# Patient Record
Sex: Female | Born: 1995 | Race: White | Hispanic: No | Marital: Married | Smoking: Never smoker
Health system: Southern US, Community
[De-identification: ages and names within clinical notes are randomized; demographics above are authoritative.]

## PROBLEM LIST (undated history)

## (undated) DIAGNOSIS — Q513 Bicornate uterus: Secondary | ICD-10-CM

## (undated) DIAGNOSIS — O3400 Maternal care for unspecified congenital malformation of uterus, unspecified trimester: Secondary | ICD-10-CM

## (undated) DIAGNOSIS — Z8759 Personal history of other complications of pregnancy, childbirth and the puerperium: Secondary | ICD-10-CM

## (undated) DIAGNOSIS — N809 Endometriosis, unspecified: Secondary | ICD-10-CM

## (undated) HISTORY — PX: LEFT OOPHORECTOMY: SHX1961

## (undated) HISTORY — DX: Personal history of other complications of pregnancy, childbirth and the puerperium: Z87.59

## (undated) HISTORY — DX: Endometriosis, unspecified: N80.9

## (undated) HISTORY — DX: Bicornate uterus: Q51.3

## (undated) HISTORY — DX: Maternal care for unspecified congenital malformation of uterus, unspecified trimester: O34.00

## (undated) HISTORY — PX: OTHER SURGICAL HISTORY: SHX169

---
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
## 2018-12-20 LAB — OB RESULTS CONSOLE GC/CHLAMYDIA
Chlamydia: NEGATIVE
Gonorrhea: NEGATIVE

## 2018-12-20 LAB — OB RESULTS CONSOLE RPR: RPR: NONREACTIVE

## 2018-12-20 LAB — OB RESULTS CONSOLE RUBELLA ANTIBODY, IGM: Rubella: IMMUNE

## 2018-12-20 LAB — OB RESULTS CONSOLE ABO/RH: RH Type: NEGATIVE

## 2018-12-20 LAB — OB RESULTS CONSOLE HGB/HCT, BLOOD
HCT: 43 — AB (ref 29–41)
Hemoglobin: 14

## 2018-12-20 LAB — OB RESULTS CONSOLE HEPATITIS B SURFACE ANTIGEN: Hepatitis B Surface Ag: NEGATIVE

## 2018-12-20 LAB — OB RESULTS CONSOLE ANTIBODY SCREEN: Antibody Screen: NEGATIVE

## 2018-12-20 LAB — OB RESULTS CONSOLE HIV ANTIBODY (ROUTINE TESTING): HIV: NONREACTIVE

## 2018-12-20 LAB — OB RESULTS CONSOLE PLATELET COUNT: Platelets: 456

## 2019-03-13 ENCOUNTER — Telehealth: Payer: Self-pay | Admitting: Student

## 2019-03-13 NOTE — Telephone Encounter (Signed)
Called the patient to inform of the upcoming scheduled appointment. Received a message the text now subscriber you are trying to reach is not available. Mailing the patient an appointment reminder letter.

## 2019-03-14 ENCOUNTER — Encounter: Payer: Self-pay | Admitting: *Deleted

## 2019-03-14 DIAGNOSIS — Z87718 Personal history of other specified (corrected) congenital malformations of genitourinary system: Secondary | ICD-10-CM | POA: Insufficient documentation

## 2019-03-14 DIAGNOSIS — N809 Endometriosis, unspecified: Secondary | ICD-10-CM | POA: Insufficient documentation

## 2019-03-14 DIAGNOSIS — Q513 Bicornate uterus: Secondary | ICD-10-CM

## 2019-03-14 DIAGNOSIS — O3400 Maternal care for unspecified congenital malformation of uterus, unspecified trimester: Secondary | ICD-10-CM

## 2019-03-21 ENCOUNTER — Encounter: Payer: Self-pay | Admitting: *Deleted

## 2019-04-02 ENCOUNTER — Encounter: Payer: Self-pay | Admitting: Obstetrics & Gynecology

## 2019-04-02 ENCOUNTER — Ambulatory Visit (INDEPENDENT_AMBULATORY_CARE_PROVIDER_SITE_OTHER): Payer: BC Managed Care – PPO | Admitting: Obstetrics & Gynecology

## 2019-04-02 ENCOUNTER — Other Ambulatory Visit: Payer: Self-pay

## 2019-04-02 DIAGNOSIS — O0990 Supervision of high risk pregnancy, unspecified, unspecified trimester: Secondary | ICD-10-CM | POA: Insufficient documentation

## 2019-04-02 DIAGNOSIS — Z23 Encounter for immunization: Secondary | ICD-10-CM | POA: Diagnosis not present

## 2019-04-02 DIAGNOSIS — Z6791 Unspecified blood type, Rh negative: Secondary | ICD-10-CM | POA: Insufficient documentation

## 2019-04-02 DIAGNOSIS — Z3A20 20 weeks gestation of pregnancy: Secondary | ICD-10-CM

## 2019-04-02 DIAGNOSIS — O0992 Supervision of high risk pregnancy, unspecified, second trimester: Secondary | ICD-10-CM

## 2019-04-02 DIAGNOSIS — O360120 Maternal care for anti-D [Rh] antibodies, second trimester, not applicable or unspecified: Secondary | ICD-10-CM

## 2019-04-02 DIAGNOSIS — O26899 Other specified pregnancy related conditions, unspecified trimester: Secondary | ICD-10-CM | POA: Insufficient documentation

## 2019-04-02 LAB — POCT URINALYSIS DIP (DEVICE)
Bilirubin Urine: NEGATIVE
Glucose, UA: NEGATIVE mg/dL
Hgb urine dipstick: NEGATIVE
Ketones, ur: NEGATIVE mg/dL
Leukocytes,Ua: NEGATIVE
Nitrite: NEGATIVE
Protein, ur: NEGATIVE mg/dL
Specific Gravity, Urine: 1.025 (ref 1.005–1.030)
Urobilinogen, UA: 1 mg/dL (ref 0.0–1.0)
pH: 6.5 (ref 5.0–8.0)

## 2019-04-02 MED ORDER — BLOOD PRESSURE KIT DEVI: 1.0000 | 0 refills | Status: DC | PRN

## 2019-04-02 NOTE — Patient Instructions (Signed)

## 2019-04-02 NOTE — Progress Notes (Signed)
Flu vaccine given today. 
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
## 2019-04-11 ENCOUNTER — Other Ambulatory Visit: Payer: Self-pay

## 2019-04-11 ENCOUNTER — Other Ambulatory Visit (HOSPITAL_COMMUNITY): Payer: Self-pay | Admitting: *Deleted

## 2019-04-11 ENCOUNTER — Ambulatory Visit (HOSPITAL_COMMUNITY): Payer: BC Managed Care – PPO | Admitting: *Deleted

## 2019-04-11 ENCOUNTER — Ambulatory Visit (HOSPITAL_COMMUNITY)
Admission: RE | Admit: 2019-04-11 | Discharge: 2019-04-11 | Disposition: A | Discharge status: Home / Self Care | Payer: BC Managed Care – PPO | Source: Ambulatory Visit | Attending: Obstetrics & Gynecology | Admitting: Obstetrics & Gynecology

## 2019-04-11 ENCOUNTER — Encounter (HOSPITAL_COMMUNITY): Payer: Self-pay

## 2019-04-11 VITALS — BP 117/77 | HR 85 | Temp 98.7°F

## 2019-04-11 DIAGNOSIS — Z3A22 22 weeks gestation of pregnancy: Secondary | ICD-10-CM

## 2019-04-11 DIAGNOSIS — Q513 Bicornate uterus: Secondary | ICD-10-CM

## 2019-04-11 DIAGNOSIS — O34592 Maternal care for other abnormalities of gravid uterus, second trimester: Secondary | ICD-10-CM

## 2019-04-11 DIAGNOSIS — O0990 Supervision of high risk pregnancy, unspecified, unspecified trimester: Secondary | ICD-10-CM

## 2019-04-11 DIAGNOSIS — O3400 Maternal care for unspecified congenital malformation of uterus, unspecified trimester: Secondary | ICD-10-CM

## 2019-04-11 DIAGNOSIS — O360120 Maternal care for anti-D [Rh] antibodies, second trimester, not applicable or unspecified: Secondary | ICD-10-CM

## 2019-04-30 ENCOUNTER — Encounter: Payer: Self-pay | Admitting: Obstetrics and Gynecology

## 2019-04-30 ENCOUNTER — Other Ambulatory Visit: Payer: Self-pay

## 2019-04-30 ENCOUNTER — Telehealth (INDEPENDENT_AMBULATORY_CARE_PROVIDER_SITE_OTHER): Payer: BC Managed Care – PPO | Admitting: Obstetrics and Gynecology

## 2019-04-30 VITALS — BP 120/76 | HR 78

## 2019-04-30 DIAGNOSIS — Z6791 Unspecified blood type, Rh negative: Secondary | ICD-10-CM

## 2019-04-30 DIAGNOSIS — O3402 Maternal care for unspecified congenital malformation of uterus, second trimester: Secondary | ICD-10-CM

## 2019-04-30 DIAGNOSIS — O283 Abnormal ultrasonic finding on antenatal screening of mother: Secondary | ICD-10-CM | POA: Insufficient documentation

## 2019-04-30 DIAGNOSIS — O360920 Maternal care for other rhesus isoimmunization, second trimester, not applicable or unspecified: Secondary | ICD-10-CM

## 2019-04-30 DIAGNOSIS — O0992 Supervision of high risk pregnancy, unspecified, second trimester: Secondary | ICD-10-CM

## 2019-04-30 DIAGNOSIS — Q513 Bicornate uterus: Secondary | ICD-10-CM

## 2019-04-30 DIAGNOSIS — O0990 Supervision of high risk pregnancy, unspecified, unspecified trimester: Secondary | ICD-10-CM

## 2019-04-30 DIAGNOSIS — O26892 Other specified pregnancy related conditions, second trimester: Secondary | ICD-10-CM

## 2019-04-30 DIAGNOSIS — O3400 Maternal care for unspecified congenital malformation of uterus, unspecified trimester: Secondary | ICD-10-CM

## 2019-04-30 NOTE — Progress Notes (Signed)
   TELEHEALTH VIRTUAL OBSTETRICS VISIT ENCOUNTER NOTE  [facility]inic: [facility]  I connected with Amanda Clements on 04/30/19 at 11:15 AM EDT by telephone at home and verified that I am speaking with the correct person using two identifiers.   I discussed the limitations, risks, security and privacy concerns of performing an evaluation and management service by telephone and the availability of in person appointments. I also discussed with the patient that there may be a patient responsible charge related to this service. The patient expressed understanding and agreed to proceed.  Subjective:  Amanda Clements is a 23 y.o. G2P0010 at [redacted]w[redacted]d being followed for ongoing prenatal care.  She is currently monitored for the following issues for this high-risk pregnancy and has Bicornate uterus complicating pregnancy; Endometriosis; Rh negative, antepartum; and Supervision of high risk pregnancy, antepartum on their problem list.  Patient reports no complaints. Reports fetal movement. Denies any contractions, bleeding or leaking of fluid.   The following portions of the patient's history were reviewed and updated as appropriate: allergies, current medications, past family history, past medical history, past social history, past surgical history and problem list.   Objective:   Vitals:   04/30/19 1121  BP: 120/76  Pulse: 78    Babyscripts Data Reviewed: yes  General:  Alert, oriented and cooperative.   Mental Status: Normal mood and affect perceived. Normal judgment and thought content.  Rest of physical exam deferred due to type of encounter  Assessment and Plan:  Pregnancy: G2P0010 at [redacted]w[redacted]d 1. Rh negative, antepartum Ab screen and rhogma nv  2. Supervision of high risk pregnancy, antepartum 28wk labs nv  3. Bicornuate uterus affecting pregnancy, antepartum mfm recommends growth in 3rd trimester D/w her re: increased risk of PTL, malpresentation, FGR  Preterm labor  symptoms and general obstetric precautions including but not limited to vaginal bleeding, contractions, leaking of fluid and fetal movement were reviewed in detail with the patient.  I discussed the assessment and treatment plan with the patient. The patient was provided an opportunity to ask questions and all were answered. The patient agreed with the plan and demonstrated an understanding of the instructions. The patient was advised to call back or seek an in-person office evaluation/go to [facility] at [facility] for any urgent or concerning symptoms. Please refer to After Visit Summary for other counseling recommendations.   I provided 10 minutes of non-face-to-face time during this encounter. The visit was conducted via Phone-medicine  Return in about 2 weeks (around 05/14/2019) for 2-3wk in person hrob and 2h GTT.  Future Appointments  Date Time Provider [place]  06/20/2019  9:15 AM [place] M[facility]-US  06/20/2019  9:15 AM [facility]-MFC [place] 4 [facility]-MFCUS MFC-US    Jordyan Hardiman, MD [facility], [place]

## 2019-05-18 ENCOUNTER — Ambulatory Visit (INDEPENDENT_AMBULATORY_CARE_PROVIDER_SITE_OTHER): Payer: BC Managed Care – PPO | Admitting: Obstetrics and Gynecology

## 2019-05-18 ENCOUNTER — Other Ambulatory Visit: Payer: Self-pay

## 2019-05-18 ENCOUNTER — Other Ambulatory Visit: Payer: BC Managed Care – PPO

## 2019-05-18 ENCOUNTER — Encounter: Payer: Self-pay | Admitting: Obstetrics and Gynecology

## 2019-05-18 VITALS — BP 114/66 | HR 79 | Temp 98.1°F | Wt 197.9 lb

## 2019-05-18 DIAGNOSIS — O0992 Supervision of high risk pregnancy, unspecified, second trimester: Secondary | ICD-10-CM

## 2019-05-18 DIAGNOSIS — Z6791 Unspecified blood type, Rh negative: Secondary | ICD-10-CM

## 2019-05-18 DIAGNOSIS — O26899 Other specified pregnancy related conditions, unspecified trimester: Secondary | ICD-10-CM

## 2019-05-18 DIAGNOSIS — O0990 Supervision of high risk pregnancy, unspecified, unspecified trimester: Secondary | ICD-10-CM

## 2019-05-18 DIAGNOSIS — Z3A27 27 weeks gestation of pregnancy: Secondary | ICD-10-CM | POA: Diagnosis not present

## 2019-05-18 DIAGNOSIS — O360120 Maternal care for anti-D [Rh] antibodies, second trimester, not applicable or unspecified: Secondary | ICD-10-CM

## 2019-05-18 DIAGNOSIS — Q513 Bicornate uterus: Secondary | ICD-10-CM

## 2019-05-18 DIAGNOSIS — O3402 Maternal care for unspecified congenital malformation of uterus, second trimester: Secondary | ICD-10-CM

## 2019-05-18 DIAGNOSIS — O26892 Other specified pregnancy related conditions, second trimester: Secondary | ICD-10-CM

## 2019-05-18 MED ORDER — RHO D IMMUNE GLOBULIN 1500 UNIT/2ML IJ SOSY
300.0000 ug | PREFILLED_SYRINGE | Freq: Once | INTRAMUSCULAR | Status: AC
  Administered 2019-05-18: 300 ug via INTRAMUSCULAR

## 2019-05-18 NOTE — Progress Notes (Signed)
Subjective:  Amanda Clements is a 23 y.o. G2P0010 at [redacted]w[redacted]d being seen today for ongoing prenatal care.  She is currently monitored for the following issues for this high-risk pregnancy and has Bicornate uterus complicating pregnancy; Endometriosis; Rh negative, antepartum; and Supervision of high risk pregnancy, antepartum on their problem list.  Patient reports no complaints.  Contractions: Not present. Vag. Bleeding: None.  Movement: Present. Denies leaking of fluid.   The following portions of the patient's history were reviewed and updated as appropriate: allergies, current medications, past family history, past medical history, past social history, past surgical history and problem list. Problem list updated.  Objective:   Vitals:   05/18/19 0902  BP: 114/66  Pulse: 79  Temp: 98.1 F (36.7 C)  Weight: 197 lb 14.4 oz (89.8 kg)    Fetal Status: Fetal Heart Rate (bpm): 138   Movement: Present     General:  Alert, oriented and cooperative. Patient is in no acute distress.  Skin: Skin is warm and dry. No rash noted.   Cardiovascular: Normal heart rate noted  Respiratory: Normal respiratory effort, no problems with respiration noted  Abdomen: Soft, gravid, appropriate for gestational age. Pain/Pressure: Present     Pelvic:  Cervical exam deferred        Extremities: Normal range of motion.  Edema: None  Mental Status: Normal mood and affect. Normal behavior. Normal judgment and thought content.   Urinalysis:      Assessment and Plan:  Pregnancy: G2P0010 at [redacted]w[redacted]d  1. Supervision of high risk pregnancy, antepartum Stable 28 week labs today  2. Rh negative, antepartum Rhogam today  3. Uterus bicornis affecting pregnancy in second trimester Stable F/U growth scan scheduled  Preterm labor symptoms and general obstetric precautions including but not limited to vaginal bleeding, contractions, leaking of fluid and fetal movement were reviewed in detail with the patient. Please refer  to After Visit Summary for other counseling recommendations.  Return in about 3 weeks (around 06/08/2019) for OB visit, virtual.   Chancy Milroy, MD

## 2019-05-18 NOTE — Patient Instructions (Signed)
Third Trimester of Pregnancy The third trimester is from week 28 through week 40 (months 7 through 9). The third trimester is a time when the unborn baby (fetus) is growing rapidly. At the end of the ninth month, the fetus is about 20 inches in length and weighs 6-10 pounds. Body changes during your third trimester Your body will continue to go through many changes during pregnancy. The changes vary from woman to woman. During the third trimester:  Your weight will continue to increase. You can expect to gain 25-35 pounds (11-16 kg) by the end of the pregnancy.  You may begin to get stretch marks on your hips, abdomen, and breasts.  You may urinate more often because the fetus is moving lower into your pelvis and pressing on your bladder.  You may develop or continue to have heartburn. This is caused by increased hormones that slow down muscles in the digestive tract.  You may develop or continue to have constipation because increased hormones slow digestion and cause the muscles that push waste through your intestines to relax.  You may develop hemorrhoids. These are swollen veins (varicose veins) in the rectum that can itch or be painful.  You may develop swollen, bulging veins (varicose veins) in your legs.  You may have increased body aches in the pelvis, back, or thighs. This is due to weight gain and increased hormones that are relaxing your joints.  You may have changes in your hair. These can include thickening of your hair, rapid growth, and changes in texture. Some women also have hair loss during or after pregnancy, or hair that feels dry or thin. Your hair will most likely return to normal after your baby is born.  Your breasts will continue to grow and they will continue to become tender. A yellow fluid (colostrum) may leak from your breasts. This is the first milk you are producing for your baby.  Your belly button may stick out.  You may notice more swelling in your hands,  face, or ankles.  You may have increased tingling or numbness in your hands, arms, and legs. The skin on your belly may also feel numb.  You may feel short of breath because of your expanding uterus.  You may have more problems sleeping. This can be caused by the size of your belly, increased need to urinate, and an increase in your body's metabolism.  You may notice the fetus "dropping," or moving lower in your abdomen (lightening).  You may have increased vaginal discharge.  You may notice your joints feel loose and you may have pain around your pelvic bone. What to expect at prenatal visits You will have prenatal exams every 2 weeks until week 36. Then you will have weekly prenatal exams. During a routine prenatal visit:  You will be weighed to make sure you and the baby are growing normally.  Your blood pressure will be taken.  Your abdomen will be measured to track your baby's growth.  The fetal heartbeat will be listened to.  Any test results from the previous visit will be discussed.  You may have a cervical check near your due date to see if your cervix has softened or thinned (effaced).  You will be tested for Group B streptococcus. This happens between 35 and 37 weeks. Your health care provider may ask you:  What your birth plan is.  How you are feeling.  If you are feeling the baby move.  If you have had any abnormal   symptoms, such as leaking fluid, bleeding, severe headaches, or abdominal cramping.  If you are using any tobacco products, including cigarettes, chewing tobacco, and electronic cigarettes.  If you have any questions. Other tests or screenings that may be performed during your third trimester include:  Blood tests that check for low iron levels (anemia).  Fetal testing to check the health, activity level, and growth of the fetus. Testing is done if you have certain medical conditions or if there are problems during the pregnancy.  Nonstress test  (NST). This test checks the health of your baby to make sure there are no signs of problems, such as the baby not getting enough oxygen. During this test, a belt is placed around your belly. The baby is made to move, and its heart rate is monitored during movement. What is false labor? False labor is a condition in which you feel small, irregular tightenings of the muscles in the womb (contractions) that usually go away with rest, changing position, or drinking water. These are called Braxton Hicks contractions. Contractions may last for hours, days, or even weeks before true labor sets in. If contractions come at regular intervals, become more frequent, increase in intensity, or become painful, you should see your health care provider. What are the signs of labor?  Abdominal cramps.  Regular contractions that start at 10 minutes apart and become stronger and more frequent with time.  Contractions that start on the top of the uterus and spread down to the lower abdomen and back.  Increased pelvic pressure and dull back pain.  A watery or bloody mucus discharge that comes from the vagina.  Leaking of amniotic fluid. This is also known as your "water breaking." It could be a slow trickle or a gush. Let your health care provider know if it has a color or strange odor. If you have any of these signs, call your health care provider right away, even if it is before your due date. Follow these instructions at home: Medicines  Follow your health care provider's instructions regarding medicine use. Specific medicines may be either safe or unsafe to take during pregnancy.  Take a prenatal vitamin that contains at least 600 micrograms (mcg) of folic acid.  If you develop constipation, try taking a stool softener if your health care provider approves. Eating and drinking   Eat a balanced diet that includes fresh fruits and vegetables, whole grains, good sources of protein such as meat, eggs, or tofu,  and low-fat dairy. Your health care provider will help you determine the amount of weight gain that is right for you.  Avoid raw meat and uncooked cheese. These carry germs that can cause birth defects in the baby.  If you have low calcium intake from food, talk to your health care provider about whether you should take a daily calcium supplement.  Eat four or five small meals rather than three large meals a day.  Limit foods that are high in fat and processed sugars, such as fried and sweet foods.  To prevent constipation: ? Drink enough fluid to keep your urine clear or pale yellow. ? Eat foods that are high in fiber, such as fresh fruits and vegetables, whole grains, and beans. Activity  Exercise only as directed by your health care provider. Most women can continue their usual exercise routine during pregnancy. Try to exercise for 30 minutes at least 5 days a week. Stop exercising if you experience uterine contractions.  Avoid heavy lifting.  Do   not exercise in extreme heat or humidity, or at high altitudes.  Wear low-heel, comfortable shoes.  Practice good posture.  You may continue to have sex unless your health care provider tells you otherwise. Relieving pain and discomfort  Take frequent breaks and rest with your legs elevated if you have leg cramps or low back pain.  Take warm sitz baths to soothe any pain or discomfort caused by hemorrhoids. Use hemorrhoid cream if your health care provider approves.  Wear a good support bra to prevent discomfort from breast tenderness.  If you develop varicose veins: ? Wear support pantyhose or compression stockings as told by your healthcare provider. ? Elevate your feet for 15 minutes, 3-4 times a day. Prenatal care  Write down your questions. Take them to your prenatal visits.  Keep all your prenatal visits as told by your health care provider. This is important. Safety  Wear your seat belt at all times when driving.  Make  a list of emergency phone numbers, including numbers for family, friends, the hospital, and police and fire departments. General instructions  Avoid cat litter boxes and soil used by cats. These carry germs that can cause birth defects in the baby. If you have a cat, ask someone to clean the litter box for you.  Do not travel far distances unless it is absolutely necessary and only with the approval of your health care provider.  Do not use hot tubs, steam rooms, or saunas.  Do not drink alcohol.  Do not use any products that contain nicotine or tobacco, such as cigarettes and e-cigarettes. If you need help quitting, ask your health care provider.  Do not use any medicinal herbs or unprescribed drugs. These chemicals affect the formation and growth of the baby.  Do not douche or use tampons or scented sanitary pads.  Do not cross your legs for long periods of time.  To prepare for the arrival of your baby: ? Take prenatal classes to understand, practice, and ask questions about labor and delivery. ? Make a trial run to the hospital. ? Visit the hospital and tour the maternity area. ? Arrange for maternity or paternity leave through employers. ? Arrange for family and friends to take care of pets while you are in the hospital. ? Purchase a rear-facing car seat and make sure you know how to install it in your car. ? Pack your hospital bag. ? Prepare the baby's nursery. Make sure to remove all pillows and stuffed animals from the baby's crib to prevent suffocation.  Visit your dentist if you have not gone during your pregnancy. Use a soft toothbrush to brush your teeth and be gentle when you floss. Contact a health care provider if:  You are unsure if you are in labor or if your water has broken.  You become dizzy.  You have mild pelvic cramps, pelvic pressure, or nagging pain in your abdominal area.  You have lower back pain.  You have persistent nausea, vomiting, or diarrhea.   You have an unusual or bad smelling vaginal discharge.  You have pain when you urinate. Get help right away if:  Your water breaks before 37 weeks.  You have regular contractions less than 5 minutes apart before 37 weeks.  You have a fever.  You are leaking fluid from your vagina.  You have spotting or bleeding from your vagina.  You have severe abdominal pain or cramping.  You have rapid weight loss or weight gain.  You have   shortness of breath with chest pain.  You notice sudden or extreme swelling of your face, hands, ankles, feet, or legs.  Your baby makes fewer than 10 movements in 2 hours.  You have severe headaches that do not go away when you take medicine.  You have vision changes. Summary  The third trimester is from week 28 through week 40, months 7 through 9. The third trimester is a time when the unborn baby (fetus) is growing rapidly.  During the third trimester, your discomfort may increase as you and your baby continue to gain weight. You may have abdominal, leg, and back pain, sleeping problems, and an increased need to urinate.  During the third trimester your breasts will keep growing and they will continue to become tender. A yellow fluid (colostrum) may leak from your breasts. This is the first milk you are producing for your baby.  False labor is a condition in which you feel small, irregular tightenings of the muscles in the womb (contractions) that eventually go away. These are called Braxton Hicks contractions. Contractions may last for hours, days, or even weeks before true labor sets in.  Signs of labor can include: abdominal cramps; regular contractions that start at 10 minutes apart and become stronger and more frequent with time; watery or bloody mucus discharge that comes from the vagina; increased pelvic pressure and dull back pain; and leaking of amniotic fluid. This information is not intended to replace advice given to you by your health  care provider. Make sure you discuss any questions you have with your health care provider. Document Released: 06/29/2001 Document Revised: 10/26/2018 Document Reviewed: 08/10/2016 Elsevier Patient Education  2020 [employer]  

## 2019-05-19 LAB — CBC
Hematocrit: 34.3 % (ref 34.0–46.6)
Hemoglobin: 11.4 g/dL (ref 11.1–15.9)
MCH: 29.8 pg (ref 26.6–33.0)
MCHC: 33.2 g/dL (ref 31.5–35.7)
MCV: 90 fL (ref 79–97)
Platelets: 416 10*3/uL (ref 150–450)
RBC: 3.82 x10E6/uL (ref 3.77–5.28)
RDW: 12.4 % (ref 11.7–15.4)
WBC: 12.9 10*3/uL — ABNORMAL HIGH (ref 3.4–10.8)

## 2019-05-19 LAB — GLUCOSE TOLERANCE, 2 HOURS W/ 1HR
Glucose, 1 hour: 85 mg/dL (ref 65–179)
Glucose, 2 hour: 73 mg/dL (ref 65–152)
Glucose, Fasting: 72 mg/dL (ref 65–91)

## 2019-05-19 LAB — RPR: RPR Ser Ql: NONREACTIVE

## 2019-05-19 LAB — ANTIBODY SCREEN: Antibody Screen: NEGATIVE

## 2019-05-19 LAB — HIV ANTIBODY (ROUTINE TESTING W REFLEX): HIV Screen 4th Generation wRfx: NONREACTIVE

## 2019-06-11 ENCOUNTER — Other Ambulatory Visit: Payer: Self-pay

## 2019-06-11 ENCOUNTER — Telehealth (INDEPENDENT_AMBULATORY_CARE_PROVIDER_SITE_OTHER): Payer: BC Managed Care – PPO | Admitting: Family Medicine

## 2019-06-11 DIAGNOSIS — O0993 Supervision of high risk pregnancy, unspecified, third trimester: Secondary | ICD-10-CM

## 2019-06-11 DIAGNOSIS — Q513 Bicornate uterus: Secondary | ICD-10-CM

## 2019-06-11 DIAGNOSIS — O0990 Supervision of high risk pregnancy, unspecified, unspecified trimester: Secondary | ICD-10-CM

## 2019-06-11 DIAGNOSIS — Z3A30 30 weeks gestation of pregnancy: Secondary | ICD-10-CM

## 2019-06-11 DIAGNOSIS — O3403 Maternal care for unspecified congenital malformation of uterus, third trimester: Secondary | ICD-10-CM

## 2019-06-11 NOTE — Progress Notes (Signed)
    TELEHEALTH OBSTETRICS PRENATAL VIRTUAL VIDEO VISIT ENCOUNTER NOTE  Provider location: [facility] [place]   I connected with Amanda Clements on 06/11/19 at  9:15 AM EST by MyChart Video Encounter at home and verified that I am speaking with the correct person using two identifiers.   I discussed the limitations, risks, security and privacy concerns of performing an evaluation and management service virtually and the availability of in person appointments. I also discussed with the patient that there may be a patient responsible charge related to this service. The patient expressed understanding and agreed to proceed. Subjective:  Amanda Clements is a 23 y.o. G2P0010 at [redacted]w[redacted]d being seen today for ongoing prenatal care.  She is currently monitored for the following issues for this high-risk pregnancy and has Bicornate uterus complicating pregnancy; Endometriosis; Rh negative, antepartum; and Supervision of high risk pregnancy, antepartum on their problem list.  Patient reports no complaints.  Contractions: Not present. Vag. Bleeding: None.  Movement: Present. Denies any leaking of fluid.   The following portions of the patient's history were reviewed and updated as appropriate: allergies, current medications, past family history, past medical history, past social history, past surgical history and problem list.   Objective:  There were no vitals filed for this visit.  Fetal Status:     Movement: Present     General:  Alert, oriented and cooperative. Patient is in no acute distress.  Respiratory: Normal respiratory effort, no problems with respiration noted  Mental Status: Normal mood and affect. Normal behavior. Normal judgment and thought content.  Rest of physical exam deferred due to type of encounter  Imaging: No results found.  Assessment and Plan:  Pregnancy: G2P0010 at [redacted]w[redacted]d 1. Supervision of high risk pregnancy, antepartum Continue routine prenatal care. Normal  28 wk labs Discussed travel/covid precautions  2. Uterus bicornis affecting pregnancy in third trimester Advised of malpresentation, and ways to turn baby now, before it is too late.  Preterm labor symptoms and general obstetric precautions including but not limited to vaginal bleeding, contractions, leaking of fluid and fetal movement were reviewed in detail with the patient. I discussed the assessment and treatment plan with the patient. The patient was provided an opportunity to ask questions and all were answered. The patient agreed with the plan and demonstrated an understanding of the instructions. The patient was advised to call back or seek an in-person office evaluation/go to MAU at [facility] for any urgent or concerning symptoms. Please refer to After Visit Summary for other counseling recommendations.   I provided 12 minutes of face-to-face time during this encounter.  Return in 2 weeks (on 06/25/2019) for virtual, [place].  Future Appointments  Date Time Provider [place]  06/20/2019  9:15 AM [place] [facility]-US  06/20/2019  9:15 AM [place] 4 WH-MFCUS MFC-US    Donnamae Jude, [place] for [facility], [place]

## 2019-06-11 NOTE — Patient Instructions (Signed)

## 2019-06-11 NOTE — Progress Notes (Signed)
I connected with  [name] on 06/11/19 at  9:15 AM EST by telephone and verified that I am speaking with the correct person using two identifiers.   I discussed the limitations, risks, security and privacy concerns of performing an evaluation and management service by telephone and the availability of in person appointments. I also discussed with the patient that there may be a patient responsible charge related to this service. The patient expressed understanding and agreed to proceed.  Derinda Late, RN 06/11/2019  9:08 AM

## 2019-06-20 ENCOUNTER — Ambulatory Visit (HOSPITAL_COMMUNITY): Payer: BC Managed Care – PPO | Admitting: *Deleted

## 2019-06-20 ENCOUNTER — Ambulatory Visit (HOSPITAL_COMMUNITY)
Admission: RE | Admit: 2019-06-20 | Discharge: 2019-06-20 | Disposition: A | Discharge status: Home / Self Care | Payer: BC Managed Care – PPO | Source: Ambulatory Visit | Attending: Obstetrics | Admitting: Obstetrics

## 2019-06-20 ENCOUNTER — Encounter (HOSPITAL_COMMUNITY): Payer: Self-pay

## 2019-06-20 ENCOUNTER — Other Ambulatory Visit (HOSPITAL_COMMUNITY): Payer: Self-pay | Admitting: Obstetrics

## 2019-06-20 ENCOUNTER — Other Ambulatory Visit: Payer: Self-pay

## 2019-06-20 ENCOUNTER — Other Ambulatory Visit (HOSPITAL_COMMUNITY): Payer: Self-pay | Admitting: *Deleted

## 2019-06-20 VITALS — BP 128/75 | HR 96 | Temp 97.8°F

## 2019-06-20 DIAGNOSIS — Z3A32 32 weeks gestation of pregnancy: Secondary | ICD-10-CM

## 2019-06-20 DIAGNOSIS — O3400 Maternal care for unspecified congenital malformation of uterus, unspecified trimester: Secondary | ICD-10-CM | POA: Insufficient documentation

## 2019-06-20 DIAGNOSIS — O0990 Supervision of high risk pregnancy, unspecified, unspecified trimester: Secondary | ICD-10-CM | POA: Diagnosis not present

## 2019-06-20 DIAGNOSIS — O359XX0 Maternal care for (suspected) fetal abnormality and damage, unspecified, not applicable or unspecified: Secondary | ICD-10-CM

## 2019-06-20 DIAGNOSIS — Z362 Encounter for other antenatal screening follow-up: Secondary | ICD-10-CM

## 2019-06-20 DIAGNOSIS — Q513 Bicornate uterus: Secondary | ICD-10-CM | POA: Insufficient documentation

## 2019-06-20 DIAGNOSIS — O34593 Maternal care for other abnormalities of gravid uterus, third trimester: Secondary | ICD-10-CM | POA: Diagnosis not present

## 2019-06-20 DIAGNOSIS — O360131 Maternal care for anti-D [Rh] antibodies, third trimester, fetus 1: Secondary | ICD-10-CM

## 2019-06-25 ENCOUNTER — Telehealth (INDEPENDENT_AMBULATORY_CARE_PROVIDER_SITE_OTHER): Payer: BC Managed Care – PPO | Admitting: Family Medicine

## 2019-06-25 VITALS — BP 113/79 | HR 82

## 2019-06-25 DIAGNOSIS — Z3A32 32 weeks gestation of pregnancy: Secondary | ICD-10-CM

## 2019-06-25 DIAGNOSIS — O3403 Maternal care for unspecified congenital malformation of uterus, third trimester: Secondary | ICD-10-CM

## 2019-06-25 DIAGNOSIS — O0993 Supervision of high risk pregnancy, unspecified, third trimester: Secondary | ICD-10-CM

## 2019-06-25 DIAGNOSIS — Q513 Bicornate uterus: Secondary | ICD-10-CM

## 2019-06-25 DIAGNOSIS — O26893 Other specified pregnancy related conditions, third trimester: Secondary | ICD-10-CM

## 2019-06-25 DIAGNOSIS — O0990 Supervision of high risk pregnancy, unspecified, unspecified trimester: Secondary | ICD-10-CM

## 2019-06-25 DIAGNOSIS — O26899 Other specified pregnancy related conditions, unspecified trimester: Secondary | ICD-10-CM

## 2019-06-25 DIAGNOSIS — Z6791 Unspecified blood type, Rh negative: Secondary | ICD-10-CM

## 2019-06-25 NOTE — Progress Notes (Signed)
I connected with  Amanda Clements on 06/25/19 at 11:15 AM EST by telephone and verified that I am speaking with the correct person using two identifiers.  I discussed the limitations, risks, security and privacy concerns of performing an evaluation and management service by telephone and the availability of in person appointments. I also discussed with the patient that there may be a patient responsible charge related to this service. The patient expressed understanding and agreed to proceed.  Annabell Howells, RN 06/25/2019  11:10 AM

## 2019-06-25 NOTE — Progress Notes (Addendum)
   TELEHEALTH VIRTUAL OBSTETRICS VISIT ENCOUNTER NOTE  I connected with Amanda Clements on 06/25/19 at 11:15 AM EST by video at home and verified that I am speaking with the correct person using two identifiers.   I discussed the limitations, risks, security and privacy concerns of performing an evaluation and management service by telephone and the availability of in person appointments. I also discussed with the patient that there may be a patient responsible charge related to this service. The patient expressed understanding and agreed to proceed.  Subjective:  Amanda Clements is a 23 y.o. G2P0010 at [redacted]w[redacted]d being followed for ongoing prenatal care.  She is currently monitored for the following issues for this high-risk pregnancy and has Bicornate uterus complicating pregnancy; Endometriosis; Rh negative, antepartum; and Supervision of high risk pregnancy, antepartum on their problem list.  Patient reports feeling dizzy a few nights ago with a headache; currently feeling well without complaints. Reports fetal movement. Denies any contractions, bleeding or leaking of fluid.   The following portions of the patient's history were reviewed and updated as appropriate: allergies, current medications, past family history, past medical history, past social history, past surgical history and problem list.   Objective:   Vitals:   06/25/19 1111  BP: 113/79  Pulse: 82   General:  Alert, oriented and cooperative.   Mental Status: Normal mood and affect perceived. Normal judgment and thought content.  Rest of physical exam deferred due to type of encounter  Assessment and Plan:  Pregnancy: G2P0010 at [redacted]w[redacted]d 1. Supervision of high risk pregnancy, antepartum Known bicornate uterus  Last [place] 12/2: Normal growth, unilateral left renal caliectasis with mild hydroureter, normal AFI Low risk NIPS Next [place] 12/30 Baby currently breech and unlikely to flip with limited room; discussed likely c-section at 39w RTC  in ~ 10 days for Tdap  2. 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 Rh negative, antepartum Rhogam given 10/30  Preterm labor symptoms and general obstetric precautions including but not limited to vaginal bleeding, contractions, leaking of fluid and fetal movement were reviewed in detail with the patient.  I discussed the assessment and treatment plan with the patient. The patient was provided an opportunity to ask questions and all were answered. The patient agreed with the plan and demonstrated an understanding of the instructions. The patient was advised to call back or seek an in-person office evaluation/go to MAU at [facility] for any urgent or concerning symptoms. Please refer to After Visit Summary for other counseling recommendations.   I provided 15 minutes of non-face-to-face time during this encounter.  Return in about 10 weeks (around 09/03/2019) for [facility]; in-person.  Future Appointments  Date Time Provider [place]  07/18/2019 10:15 AM [place] 4 WH-MFCUS MFC-US  07/18/2019 10:25 AM WH-MFC NURSE WH-MFC MFC-US    Chauncey Mann, MD [facility] [employer], [place]

## 2019-06-28 ENCOUNTER — Other Ambulatory Visit: Payer: Self-pay

## 2019-06-28 ENCOUNTER — Inpatient Hospital Stay (HOSPITAL_COMMUNITY)
Admission: AD | Admit: 2019-06-28 | Discharge: 2019-06-28 | Disposition: A | Discharge status: Home / Self Care | Payer: BC Managed Care – PPO | Attending: Obstetrics and Gynecology | Admitting: Obstetrics and Gynecology

## 2019-06-28 DIAGNOSIS — O26893 Other specified pregnancy related conditions, third trimester: Secondary | ICD-10-CM

## 2019-06-28 DIAGNOSIS — B373 Candidiasis of vulva and vagina: Secondary | ICD-10-CM

## 2019-06-28 DIAGNOSIS — O4703 False labor before 37 completed weeks of gestation, third trimester: Secondary | ICD-10-CM | POA: Diagnosis not present

## 2019-06-28 DIAGNOSIS — M549 Dorsalgia, unspecified: Secondary | ICD-10-CM | POA: Insufficient documentation

## 2019-06-28 DIAGNOSIS — O99891 Other specified diseases and conditions complicating pregnancy: Secondary | ICD-10-CM | POA: Diagnosis not present

## 2019-06-28 DIAGNOSIS — R197 Diarrhea, unspecified: Secondary | ICD-10-CM | POA: Diagnosis not present

## 2019-06-28 DIAGNOSIS — B3731 Acute candidiasis of vulva and vagina: Secondary | ICD-10-CM

## 2019-06-28 DIAGNOSIS — O479 False labor, unspecified: Secondary | ICD-10-CM

## 2019-06-28 DIAGNOSIS — M79604 Pain in right leg: Secondary | ICD-10-CM | POA: Insufficient documentation

## 2019-06-28 DIAGNOSIS — N898 Other specified noninflammatory disorders of vagina: Secondary | ICD-10-CM | POA: Diagnosis not present

## 2019-06-28 DIAGNOSIS — R102 Pelvic and perineal pain: Secondary | ICD-10-CM | POA: Diagnosis not present

## 2019-06-28 DIAGNOSIS — O26899 Other specified pregnancy related conditions, unspecified trimester: Secondary | ICD-10-CM

## 2019-06-28 DIAGNOSIS — Z3A33 33 weeks gestation of pregnancy: Secondary | ICD-10-CM | POA: Insufficient documentation

## 2019-06-28 DIAGNOSIS — R1030 Lower abdominal pain, unspecified: Secondary | ICD-10-CM | POA: Diagnosis not present

## 2019-06-28 DIAGNOSIS — O98813 Other maternal infectious and parasitic diseases complicating pregnancy, third trimester: Secondary | ICD-10-CM | POA: Insufficient documentation

## 2019-06-28 LAB — WET PREP, GENITAL
Clue Cells Wet Prep HPF POC: NONE SEEN
Sperm: NONE SEEN
Trich, Wet Prep: NONE SEEN
Yeast Wet Prep HPF POC: NONE SEEN

## 2019-06-28 LAB — URINALYSIS, ROUTINE W REFLEX MICROSCOPIC
Bilirubin Urine: NEGATIVE
Glucose, UA: NEGATIVE mg/dL
Hgb urine dipstick: NEGATIVE
Ketones, ur: NEGATIVE mg/dL
Leukocytes,Ua: NEGATIVE
Nitrite: NEGATIVE
Protein, ur: NEGATIVE mg/dL
Specific Gravity, Urine: 1.003 — ABNORMAL LOW (ref 1.005–1.030)
pH: 6 (ref 5.0–8.0)

## 2019-06-28 LAB — FETAL FIBRONECTIN: Fetal Fibronectin: NEGATIVE

## 2019-06-28 LAB — POCT FERN TEST: POCT Fern Test: NEGATIVE

## 2019-06-28 MED ORDER — CYCLOBENZAPRINE HCL 5 MG PO TABS: 5.0000 mg | ORAL_TABLET | Freq: Three times a day (TID) | ORAL | 0 refills | Status: DC | PRN

## 2019-06-28 MED ORDER — TERCONAZOLE 0.4 % VA CREA: 1.0000 | TOPICAL_CREAM | Freq: Every day | VAGINAL | 0 refills | Status: DC
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
## 2019-06-28 NOTE — Discharge Instructions (Signed)
Braxton Hicks Contractions °Contractions of the uterus can occur throughout pregnancy, but they are not always a sign that you are in labor. You may have practice contractions called Braxton Hicks contractions. These false labor contractions are sometimes confused with true labor. °What are Braxton Hicks contractions? °Braxton Hicks contractions are tightening movements that occur in the muscles of the uterus before labor. Unlike true labor contractions, these contractions do not result in opening (dilation) and thinning of the cervix. Toward the end of pregnancy (32-34 weeks), Braxton Hicks contractions can happen more often and may become stronger. These contractions are sometimes difficult to tell apart from true labor because they can be very uncomfortable. You should not feel embarrassed if you go to the hospital with false labor. °Sometimes, the only way to tell if you are in true labor is for your health care provider to look for changes in the cervix. The health care provider will do a physical exam and may monitor your contractions. If you are not in true labor, the exam should show that your cervix is not dilating and your water has not broken. °If there are no other health problems associated with your pregnancy, it is completely safe for you to be sent home with false labor. You may continue to have Braxton Hicks contractions until you go into true labor. °How to tell the difference between true labor and false labor °True labor °· Contractions last 30-70 seconds. °· Contractions become very regular. °· Discomfort is usually felt in the top of the uterus, and it spreads to the lower abdomen and low back. °· Contractions do not go away with walking. °· Contractions usually become more intense and increase in frequency. °· The cervix dilates and gets thinner. °False labor °· Contractions are usually shorter and not as strong as true labor contractions. °· Contractions are usually irregular. °· Contractions  are often felt in the front of the lower abdomen and in the groin. °· Contractions may go away when you walk around or change positions while lying down. °· Contractions get weaker and are shorter-lasting as time goes on. °· The cervix usually does not dilate or become thin. °Follow these instructions at home: ° °· Take over-the-counter and prescription medicines only as told by your health care provider. °· Keep up with your usual exercises and follow other instructions from your health care provider. °· Eat and drink lightly if you think you are going into labor. °· If Braxton Hicks contractions are making you uncomfortable: °? Change your position from lying down or resting to walking, or change from walking to resting. °? Sit and rest in a tub of warm water. °? Drink enough fluid to keep your urine pale yellow. Dehydration may cause these contractions. °? Do slow and deep breathing several times an hour. °· Keep all follow-up prenatal visits as told by your health care provider. This is important. °Contact a health care provider if: °· You have a fever. °· You have continuous pain in your abdomen. °Get help right away if: °· Your contractions become stronger, more regular, and closer together. °· You have fluid leaking or gushing from your vagina. °· You pass blood-tinged mucus (bloody show). °· You have bleeding from your vagina. °· You have low back pain that you never had before. °· You feel your baby’s head pushing down and causing pelvic pressure. °· Your baby is not moving inside you as much as it used to. °Summary °· Contractions that occur before labor are   called Braxton Hicks contractions, false labor, or practice contractions. °· Braxton Hicks contractions are usually shorter, weaker, farther apart, and less regular than true labor contractions. True labor contractions usually become progressively stronger and regular, and they become more frequent. °· Manage discomfort from Braxton Hicks contractions  by changing position, resting in a warm bath, drinking plenty of water, or practicing deep breathing. °This information is not intended to replace advice given to you by your health care provider. Make sure you discuss any questions you have with your health care provider. °Document Released: 11/18/2016 Document Revised: 06/17/2017 Document Reviewed: 11/18/2016 °Elsevier Patient Education © 2020 [employer] Inc[employer] °Vaginal Yeast infection, Adult ° °Vaginal yeast infection is a condition that causes vaginal discharge as well as soreness, swelling, and redness (inflammation) of the vagina. This is a common condition. Some women get this infection frequently. °What are the causes? °This condition is caused by a change in the normal balance of the yeast (candida) and bacteria that live in the vagina. This change causes an overgrowth of yeast, which causes the inflammation. °What increases the risk? °The condition is more likely to develop in women who: °· Take antibiotic medicines. °· Have diabetes. °· Take birth control pills. °· Are pregnant. °· Douche often. °· Have a weak body defense system (immune system). °· Have been taking steroid medicines for a long time. °· Frequently wear tight clothing. °What are the signs or symptoms? °Symptoms of this condition include: °· White, thick, creamy vaginal discharge. °· Swelling, itching, redness, and irritation of the vagina. The lips of the vagina (vulva) may be affected as well. °· Pain or a burning feeling while urinating. °· Pain during sex. °How is this diagnosed? °This condition is diagnosed based on: °· Your medical history. °· A physical exam. °· A pelvic exam. Your health care provider will examine a sample of your vaginal discharge under a microscope. Your health care provider may send this sample for testing to confirm the diagnosis. °How is this treated? °This condition is treated with medicine. Medicines may be over-the-counter or prescription. You may be told to use  one or more of the following: °· Medicine that is taken by mouth (orally). °· Medicine that is applied as a cream (topically). °· Medicine that is inserted directly into the vagina (suppository). °Follow these instructions at home: ° °Lifestyle °· Do not have sex until your health care provider approves. Tell your sex partner that you have a yeast infection. That person should go to his or her health care provider and ask if they should also be treated. °· Do not wear tight clothes, such as pantyhose or tight pants. °· Wear breathable cotton underwear. °General instructions °· Take or apply over-the-counter and prescription medicines only as told by your health care provider. °· Eat more yogurt. This may help to keep your yeast infection from returning. °· Do not use tampons until your health care provider approves. °· Try taking a sitz bath to help with discomfort. This is a warm water bath that is taken while you are sitting down. The water should only come up to your hips and should cover your buttocks. Do this 3-4 times per day or as told by your health care provider. °· Do not douche. °· If you have diabetes, keep your blood sugar levels under control. °· Keep all follow-up visits as told by your health care provider. This is important. °Contact a health care provider if: °· You have a fever. °· Your symptoms go   away and then return. °· Your symptoms do not get better with treatment. °· Your symptoms get worse. °· You have new symptoms. °· You develop blisters in or around your vagina. °· You have blood coming from your vagina and it is not your menstrual period. °· You develop pain in your abdomen. °Summary °· Vaginal yeast infection is a condition that causes discharge as well as soreness, swelling, and redness (inflammation) of the vagina. °· This condition is treated with medicine. Medicines may be over-the-counter or prescription. °· Take or apply over-the-counter and prescription medicines only as told by  your health care provider. °· Do not douche. Do not have sex or use tampons until your health care provider approves. °· Contact a health care provider if your symptoms do not get better with treatment or your symptoms go away and then return. °This information is not intended to replace advice given to you by your health care provider. Make sure you discuss any questions you have with your health care provider. °Document Released: 04/14/2005 Document Revised: 11/21/2017 Document Reviewed: 11/21/2017 °Elsevier Patient Education © 2020 Elsevier Inc. ° °

## 2019-06-28 NOTE — MAU Note (Signed)
.   Amanda Clements is a 24 y.o. at [redacted]w[redacted]d here in MAU reporting: lower abdominal pain and back pain with pelvic pain and right leg pain. Denies any VB, increase in vaginal discharge  Onset of complaint: ongoing Pain score: 4 Vitals:   06/28/19 1849  BP: (!) 137/8  Pulse: 84  Resp: 18  Temp: 98.3 F (36.8 C)  SpO2: 100%     FHT:122 Lab orders placed from triage: [facility]

## 2019-06-29 ENCOUNTER — Telehealth: Payer: Self-pay | Admitting: General Practice

## 2019-06-29 ENCOUNTER — Encounter: Payer: Self-pay | Admitting: *Deleted

## 2019-06-29 NOTE — Telephone Encounter (Signed)
Received alert from [employer] patient had elevated BP 144/73 with repeat 129/81 and 132/74 with headaches and blurry vision at that time. Reviewed with Dr Rip Harbour who finds repeat BP WNL, which is reassuring. Will reach out to patient with precautions.  Called patient, no answer- left message asking she check her mychart account for information regarding today's phone call.

## 2019-07-02 LAB — GC/CHLAMYDIA PROBE AMP (~~LOC~~) NOT AT ARMC
Chlamydia: NEGATIVE
Comment: NEGATIVE
Comment: NORMAL
Neisseria Gonorrhea: NEGATIVE

## 2019-07-05 ENCOUNTER — Encounter: Payer: Self-pay | Admitting: *Deleted

## 2019-07-05 ENCOUNTER — Telehealth: Payer: Self-pay

## 2019-07-05 DIAGNOSIS — R05 Cough: Secondary | ICD-10-CM | POA: Diagnosis not present

## 2019-07-05 DIAGNOSIS — Z20828 Contact with and (suspected) exposure to other viral communicable diseases: Secondary | ICD-10-CM | POA: Diagnosis not present

## 2019-07-05 DIAGNOSIS — R509 Fever, unspecified: Secondary | ICD-10-CM | POA: Diagnosis not present

## 2019-07-05 DIAGNOSIS — R6883 Chills (without fever): Secondary | ICD-10-CM | POA: Diagnosis not present

## 2019-07-05 NOTE — Telephone Encounter (Signed)
Patient called in and stated she tested positive for covid-19 today and her symptoms started 2 days ago. Advised to stay home for 14 days and quarantine and that her visit will be changed to mychart. Patient verbalized understanding and ended the call.

## 2019-07-09 ENCOUNTER — Other Ambulatory Visit: Payer: Self-pay

## 2019-07-09 ENCOUNTER — Telehealth (INDEPENDENT_AMBULATORY_CARE_PROVIDER_SITE_OTHER): Payer: BC Managed Care – PPO | Admitting: Obstetrics and Gynecology

## 2019-07-09 ENCOUNTER — Encounter (INDEPENDENT_AMBULATORY_CARE_PROVIDER_SITE_OTHER): Payer: Self-pay

## 2019-07-09 ENCOUNTER — Encounter: Payer: Self-pay | Admitting: Obstetrics and Gynecology

## 2019-07-09 VITALS — BP 132/85 | HR 77

## 2019-07-09 DIAGNOSIS — O3403 Maternal care for unspecified congenital malformation of uterus, third trimester: Secondary | ICD-10-CM

## 2019-07-09 DIAGNOSIS — Z3A34 34 weeks gestation of pregnancy: Secondary | ICD-10-CM

## 2019-07-09 DIAGNOSIS — Q513 Bicornate uterus: Secondary | ICD-10-CM

## 2019-07-09 DIAGNOSIS — Z6791 Unspecified blood type, Rh negative: Secondary | ICD-10-CM

## 2019-07-09 DIAGNOSIS — U071 COVID-19: Secondary | ICD-10-CM

## 2019-07-09 DIAGNOSIS — O0990 Supervision of high risk pregnancy, unspecified, unspecified trimester: Secondary | ICD-10-CM

## 2019-07-09 DIAGNOSIS — O98513 Other viral diseases complicating pregnancy, third trimester: Secondary | ICD-10-CM

## 2019-07-09 DIAGNOSIS — O0993 Supervision of high risk pregnancy, unspecified, third trimester: Secondary | ICD-10-CM

## 2019-07-09 DIAGNOSIS — O26893 Other specified pregnancy related conditions, third trimester: Secondary | ICD-10-CM

## 2019-07-09 DIAGNOSIS — O360930 Maternal care for other rhesus isoimmunization, third trimester, not applicable or unspecified: Secondary | ICD-10-CM

## 2019-07-09 NOTE — Progress Notes (Signed)
TELEHEALTH OBSTETRICS PRENATAL VIRTUAL VIDEO VISIT ENCOUNTER NOTE  Provider location: Center for [employer] [facility] at [place]   I connected with Amanda Clements on 07/09/19 at  3:15 PM EST by MyChart Video Encounter at home and verified that I am speaking with the correct person using two identifiers.   I discussed the limitations, risks, security and privacy concerns of performing an evaluation and management service virtually and the availability of in person appointments. I also discussed with the patient that there may be a patient responsible charge related to this service. The patient expressed understanding and agreed to proceed. Subjective:  Amanda Clements is a 23 y.o. G2P0010 at [redacted]w[redacted]d being seen today for ongoing prenatal care.  She is currently monitored for the following issues for this high-risk pregnancy and has Bicornate uterus complicating pregnancy; Endometriosis; Rh negative, antepartum; Supervision of high risk pregnancy, antepartum; and COVID-19 affecting pregnancy, antepartum on their problem list.  Patient reports no complaints in terms of pregnancy. Feels stuffy, no taste or smell, but denies shortness of breath.  Contractions: Irritability. Vag. Bleeding: None.  Movement: Present. Denies any leaking of fluid.   The following portions of the patient's history were reviewed and updated as appropriate: allergies, current medications, past family history, past medical history, past social history, past surgical history and problem list.   Objective:   Vitals:   07/09/19 1516  BP: 132/85  Pulse: 77    Fetal Status:     Movement: Present     General:  Alert, oriented and cooperative. Patient is in no acute distress.  Respiratory: Normal respiratory effort, no problems with respiration noted  Mental Status: Normal mood and affect. Normal behavior. Normal judgment and thought content.  Rest of physical exam deferred due to type of encounter  Imaging:   Assessment and Plan:   Pregnancy: G2P0010 at [redacted]w[redacted]d  1. Supervision of high risk pregnancy, antepartum  2. Rh negative, antepartum S/p Rho gam  3. 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 COVID-19 affecting pregnancy, antepartum Dx 07/05/19 Doing better now than on diagnosis   Preterm labor symptoms and general obstetric precautions including but not limited to vaginal bleeding, contractions, leaking of fluid and fetal movement were reviewed in detail with the patient. I discussed the assessment and treatment plan with the patient. The patient was provided an opportunity to ask questions and all were answered. The patient agreed with the plan and demonstrated an understanding of the instructions. The patient was advised to call back or seek an in-person office evaluation/go to MAU at [facility] for any urgent or concerning symptoms. Please refer to After Visit Summary for other counseling recommendations.   I provided 20 minutes of face-to-face time during this encounter.  Return in about 1 week (around 07/16/2019) for high OB, virtual.  Future Appointments  Date Time Provider [place]  07/18/2019 10:15 AM WH-MFC [place] 4 WH-MFCUS MFC-US  07/18/2019 10:25 AM [place] NURSE [place] MFC-US    Sloan Leiter, [place] for [place], [facility]

## 2019-07-09 NOTE — Progress Notes (Signed)
I connected with  [name] on 07/09/19 at  3:15 PM EST by telephone and verified that I am speaking with the correct person using two identifiers.   I discussed the limitations, risks, security and privacy concerns of performing an evaluation and management service by telephone and the availability of in person appointments. I also discussed with the patient that there may be a patient responsible charge related to this service. The patient expressed understanding and agreed to proceed.  Bethanne Ginger, [place] 07/09/2019  3:15 PM

## 2019-07-10 ENCOUNTER — Encounter (INDEPENDENT_AMBULATORY_CARE_PROVIDER_SITE_OTHER): Payer: Self-pay

## 2019-07-11 ENCOUNTER — Encounter (INDEPENDENT_AMBULATORY_CARE_PROVIDER_SITE_OTHER): Payer: Self-pay

## 2019-07-13 ENCOUNTER — Encounter (INDEPENDENT_AMBULATORY_CARE_PROVIDER_SITE_OTHER): Payer: Self-pay

## 2019-07-14 ENCOUNTER — Encounter (INDEPENDENT_AMBULATORY_CARE_PROVIDER_SITE_OTHER): Payer: Self-pay

## 2019-07-15 ENCOUNTER — Encounter (INDEPENDENT_AMBULATORY_CARE_PROVIDER_SITE_OTHER): Payer: Self-pay

## 2019-07-16 ENCOUNTER — Other Ambulatory Visit: Payer: Self-pay

## 2019-07-16 ENCOUNTER — Encounter: Payer: Self-pay | Admitting: *Deleted

## 2019-07-16 ENCOUNTER — Encounter (INDEPENDENT_AMBULATORY_CARE_PROVIDER_SITE_OTHER): Payer: Self-pay

## 2019-07-16 ENCOUNTER — Telehealth (INDEPENDENT_AMBULATORY_CARE_PROVIDER_SITE_OTHER): Payer: BC Managed Care – PPO | Admitting: Family Medicine

## 2019-07-16 VITALS — BP 122/86 | HR 93

## 2019-07-16 DIAGNOSIS — Z3A35 35 weeks gestation of pregnancy: Secondary | ICD-10-CM

## 2019-07-16 DIAGNOSIS — O3403 Maternal care for unspecified congenital malformation of uterus, third trimester: Secondary | ICD-10-CM

## 2019-07-16 DIAGNOSIS — O0990 Supervision of high risk pregnancy, unspecified, unspecified trimester: Secondary | ICD-10-CM

## 2019-07-16 DIAGNOSIS — O0993 Supervision of high risk pregnancy, unspecified, third trimester: Secondary | ICD-10-CM

## 2019-07-16 DIAGNOSIS — Q513 Bicornate uterus: Secondary | ICD-10-CM

## 2019-07-16 NOTE — Progress Notes (Signed)
I connected with  [name] on 07/16/19 at 10:15 AM EST by telephone and verified that I am speaking with the correct person using two identifiers.   I discussed the limitations, risks, security and privacy concerns of performing an evaluation and management service by telephone and the availability of in person appointments. I also discussed with the patient that there may be a patient responsible charge related to this service. The patient expressed understanding and agreed to proceed.  Verdell Carmine, RN 07/16/2019  10:01 AM

## 2019-07-16 NOTE — Patient Instructions (Signed)

## 2019-07-16 NOTE — Progress Notes (Signed)
TELEHEALTH OBSTETRICS PRENATAL VIRTUAL VIDEO VISIT ENCOUNTER NOTE  Provider location: [facility] [place]   I connected with Amanda MagnusAvery Clements on 07/16/19 at 10:15 AM EST by MyChart Video Encounter at home and verified that I am speaking with the correct person using two identifiers.   I discussed the limitations, risks, security and privacy concerns of performing an evaluation and management service virtually and the availability of in person appointments. I also discussed with the patient that there may be a patient responsible charge related to this service. The patient expressed understanding and agreed to proceed. Subjective:  Amanda Clements is a 23 y.o. G2P0010 at 6365w5d being seen today for ongoing prenatal care.  She is currently monitored for the following issues for this high-risk pregnancy and has Bicornate uterus complicating pregnancy; Endometriosis; Rh negative, antepartum; Supervision of high risk pregnancy, antepartum; and COVID-19 affecting pregnancy, antepartum on their problem list.  Patient reports no complaints.  Contractions: Irritability. Vag. Bleeding: None.  Movement: Present. Denies any leaking of fluid.   The following portions of the patient's history were reviewed and updated as appropriate: allergies, current medications, past family history, past medical history, past social history, past surgical history and problem list.   Objective:   Vitals:   07/16/19 1002  BP: 122/86  Pulse: 93    Fetal Status:     Movement: Present     General:  Alert, oriented and cooperative. Patient is in no acute distress.  Respiratory: Normal respiratory effort, no problems with respiration noted  Mental Status: Normal mood and affect. Normal behavior. Normal judgment and thought content.  Rest of physical exam deferred due to type of encounter  Imaging: US MFM FETAL BPP WO NON STRESS  Result Date: 06/20/2019  ----------------------------------------------------------------------  OBSTETRICS REPORT                    (Corrected Final 06/20/2019 10:45 am) ---------------------------------------------------------------------- Patient Info  ID #:       [id number]                          D.O.B.:  02/25/1996 (23 yrs)  Name:       Amanda Clements                    Visit Date: 06/20/2019 09:25 am ---------------------------------------------------------------------- Performed By  Performed By:     Earley BrookeNicole S Dalrymple     Ref. Address:     [address]                    [address], [address]                                                             [address]                                                             [place], [place]  [address]  Attending:        Lin Landsman      [facility]                    MD                                       Fetal Care  Referred By:      Adam Phenix                    MD ---------------------------------------------------------------------- Orders   #  Description                          Code         Ordered By   1  [place] MFM OB FOLLOW UP                  76816.01     YU FANG   2  [place] MFM FETAL BPP WO NON              76819.01     YU FANG      STRESS  ----------------------------------------------------------------------   #  Order #                    Accession #                 Episode #   1  [id number]                  [id number]                  [id number]   2  [id number]                  [id number]                  [id number]  ---------------------------------------------------------------------- Indications   Bicornuate uterus, 2ND trimester (H/O          O34.592 Q51.3   resection of rudimentary horn)   [redacted] weeks gestation of pregnancy                Z3A.32   Rh negative state in antepartum                O36.0190   Encounter for antenatal screening for          Z36.3   malformations (low risk NIPS)   Decreased fetal  movements, third trimester,    O36.8130   unspecified  ---------------------------------------------------------------------- Fetal Evaluation  Num Of Fetuses:         1  Fetal Heart Rate(bpm):  128  Cardiac Activity:       Observed  Presentation:           Breech  Placenta:               Left lateral  P. Cord Insertion:      Previously Visualized  Amniotic Fluid  AFI FV:      Within normal limits  AFI Sum(cm)     %Tile       Largest Pocket(cm)  15.75           56          4.89  RUQ(cm)       RLQ(cm)       LUQ(cm)        LLQ(cm)  3.5           3.33          4.89           4.03 ---------------------------------------------------------------------- Biometry  BPD:      80.9  mm     G. Age:  32w 3d         56  %    CI:         75.7   %    70 - 86                                                          FL/HC:      20.2   %    19.1 - 21.3  HC:      294.8  mm     G. Age:  32w 4d         27  %    HC/AC:      1.03        0.96 - 1.17  AC:      286.3  mm     G. Age:  32w 5d         68  %    FL/BPD:     73.7   %    71 - 87  FL:       59.6  mm     G. Age:  31w 0d         15  %    FL/AC:      20.8   %    20 - 24  HUM:      54.4  mm     G. Age:  31w 4d         43  %  LV:        8.5  mm  Est. FW:    1914  gm      4 lb 4 oz     44  % ---------------------------------------------------------------------- OB History  Gravidity:    2  Ectopic:      1 ---------------------------------------------------------------------- Gestational Age  LMP:           32w 0d        Date:  11/08/18                 EDD:   08/15/19  U/S Today:     32w 1d                                        EDD:   08/14/19  Best:          32w 0d     Det. By:  LMP  (11/08/18)          EDD:   08/15/19 ---------------------------------------------------------------------- Anatomy  Cranium:               Appears normal         Aortic Arch:            Previously seen  Cavum:  Previously seen        Ductal Arch:            Previously seen  Choroid Plexus:         Previously seen        Diaphragm:              Appears normal  Cerebellum:            Previously seen        Stomach:                Appears normal, left                                                                        sided  Posterior Fossa:       Previously seen        Abdomen:                Appears normal  Nuchal Fold:           Not applicable (>20    Abdominal Wall:         Previously seen                         wks GA)  Face:                  Orbits and profile     Cord Vessels:           Previously seen                         previously seen  Lips:                  Previously seen        Kidneys:                Abnormal, see                                                                        comments  Palate:                Not well visualized    Bladder:                Appears normal  Thoracic:              Appears normal         Spine:                  Previously seen  Heart:                 Appears normal         Upper Extremities:      Previously seen                         (4CH, axis, and  situs)  RVOT:                  Previously seen        Lower Extremities:      Previously seen  LVOT:                  Previously seen  Other:  Female gender previously seen. Heels and Nasal bone previously          visualized. ---------------------------------------------------------------------- Cervix Uterus Adnexa  Cervix  Not visualized (advanced GA >24wks)  Left Ovary  Previously seen.  Right Ovary  Previously seen ---------------------------------------------------------------------- Impression  Normal interval growth.  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  Good fetal movement is observed.  Ms. Demers has known bicornuate uterus.  Low risk NIPS ---------------------------------------------------------------------- Recommendations  Repeat growth in 3-4 weeks.  ----------------------------------------------------------------------                    Lin Landsman, MD Electronically Signed Corrected Final Report  06/20/2019 10:45 am ----------------------------------------------------------------------  [place] MFM OB FOLLOW UP  Result Date: 06/20/2019 ----------------------------------------------------------------------  OBSTETRICS REPORT                    (Corrected Final 06/20/2019 10:45 am) ---------------------------------------------------------------------- Patient Info  ID #:       [id number]                          D.O.B.:  11-24-1995 (23 yrs)  Name:       Amanda Clements                    Visit Date: 06/20/2019 09:25 am ---------------------------------------------------------------------- Performed By  Performed By:     Earley Brooke     Ref. Address:     [address]                                                             [address]                                                             [place], [place]                                                             [address]  Attending:        Lin Landsman      [facility]                    MD  [facility]etal Care  Referred By:      Woodroe Mode                    MD ---------------------------------------------------------------------- Orders   #  Description                          Code         Ordered By   1  [place] MFM OB FOLLOW UP                  [phone]     YU FANG   2  [place] MFM FETAL BPP WO NON              76819.01     YU FANG      STRESS  ----------------------------------------------------------------------   #  Order #                    Accession #                 Episode #   1  [id number]                  [id number]                  [id number]   2  [id number]                  [id number]                  [id number]  ---------------------------------------------------------------------- Indications   Bicornuate  uterus, 2ND trimester (H/O          O34.592 Q51.3   resection of rudimentary horn)   [redacted] weeks gestation of pregnancy                Z3A.32   Rh negative state in antepartum                O36.0190   Encounter for antenatal screening for          Z36.3   malformations (low risk NIPS)   Decreased fetal movements, third trimester,    O36.8130   unspecified  ---------------------------------------------------------------------- Fetal Evaluation  Num Of Fetuses:         1  Fetal Heart Rate(bpm):  128  Cardiac Activity:       Observed  Presentation:           Breech  Placenta:               Left lateral  P. Cord Insertion:      Previously Visualized  Amniotic Fluid  AFI FV:      Within normal limits  AFI Sum(cm)     %Tile       Largest Pocket(cm)  15.75           56          4.89  RUQ(cm)       RLQ(cm)       LUQ(cm)        LLQ(cm)  3.5           3.33          4.89           4.03 ---------------------------------------------------------------------- Biometry  BPD:      80.9  mm     G. Age:  32w 3d  56  %    CI:         75.7   %    70 - 86                                                          FL/HC:      20.2   %    19.1 - 21.3  HC:      294.8  mm     G. Age:  32w 4d         27  %    HC/AC:      1.03        0.96 - 1.17  AC:      286.3  mm     G. Age:  32w 5d         68  %    FL/BPD:     73.7   %    71 - 87  FL:       59.6  mm     G. Age:  31w 0d         15  %    FL/AC:      20.8   %    20 - 24  HUM:      54.4  mm     G. Age:  31w 4d         43  %  LV:        8.5  mm  Est. FW:    1914  gm      4 lb 4 oz     44  % ---------------------------------------------------------------------- OB History  Gravidity:    2  Ectopic:      1 ---------------------------------------------------------------------- Gestational Age  LMP:           32w 0d        Date:  11/08/18                 EDD:   08/15/19  U/S Today:     32w 1d                                        EDD:   08/14/19  Best:          32w 0d     Det. By:  LMP   (11/08/18)          EDD:   08/15/19 ---------------------------------------------------------------------- Anatomy  Cranium:               Appears normal         Aortic Arch:            Previously seen  Cavum:                 Previously seen        Ductal Arch:            Previously seen  Choroid Plexus:        Previously seen        Diaphragm:              Appears normal  Cerebellum:            Previously  seen        Stomach:                Appears normal, left                                                                        sided  Posterior Fossa:       Previously seen        Abdomen:                Appears normal  Nuchal Fold:           Not applicable (>20    Abdominal Wall:         Previously seen                         wks GA)  Face:                  Orbits and profile     Cord Vessels:           Previously seen                         previously seen  Lips:                  Previously seen        Kidneys:                Abnormal, see                                                                        comments  Palate:                Not well visualized    Bladder:                Appears normal  Thoracic:              Appears normal         Spine:                  Previously seen  Heart:                 Appears normal         Upper Extremities:      Previously seen                         (4CH, axis, and                         situs)  RVOT:                  Previously seen        Lower Extremities:      Previously seen  LVOT:  Previously seen  Other:  Female gender previously seen. Heels and Nasal bone previously          visualized. ---------------------------------------------------------------------- Cervix Uterus Adnexa  Cervix  Not visualized (advanced GA >24wks)  Left Ovary  Previously seen.  Right Ovary  Previously seen ---------------------------------------------------------------------- Impression  Normal interval growth.  Unilateral left renal caliectasis with mild  hydroureter, there is  normal amniotic fluid volume.  Good fetal movement is observed.  Ms. Arington has known bicornuate uterus.  Low risk NIPS ---------------------------------------------------------------------- Recommendations  Repeat growth in 3-4 weeks. ----------------------------------------------------------------------                    Lin Landsman, MD Electronically Signed Corrected Final Report  06/20/2019 10:45 am ----------------------------------------------------------------------   Assessment and Plan:  Pregnancy: G2P0010 at [redacted]w[redacted]d 1. 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 Will treat as Myomectomy with early delivery  Scheduled on 37 2/7 wks due to COVID + on 12/17.  2. Supervision of high risk pregnancy, antepartum Continue routine prenatal care.   Preterm labor symptoms and general obstetric precautions including but not limited to vaginal bleeding, contractions, leaking of fluid and fetal movement were reviewed in detail with the patient. I discussed the assessment and treatment plan with the patient. The patient was provided an opportunity to ask questions and all were answered. The patient agreed with the plan and demonstrated an understanding of the instructions. The patient was advised to call back or seek an in-person office evaluation/go to MAU at [facility] [place] for any urgent or concerning symptoms. Please refer to After Visit Summary for other counseling recommendations.   I provided 14 minutes of face-to-face time during this encounter.  Return in 3 weeks (on 08/06/2019) for postop check.  Future Appointments  Date Time Provider Department Center  07/18/2019 10:15 AM WH-MFC [place] 4 WH-MFCUS MFC-US  07/18/2019 10:25 AM WH-MFC NURSE WH-MFC MFC-US  07/24/2019 11:15 AM Conan Bowens, MD WOC-WOCA WOC    Reva Bores, MD Center for [facility], [facility]

## 2019-07-17 ENCOUNTER — Encounter (HOSPITAL_COMMUNITY): Payer: Self-pay

## 2019-07-17 ENCOUNTER — Encounter (INDEPENDENT_AMBULATORY_CARE_PROVIDER_SITE_OTHER): Payer: Self-pay

## 2019-07-17 NOTE — Patient Instructions (Signed)
Tamber Burtch  07/17/2019   Your procedure is scheduled on:  07/27/2019  Arrive at 0830 at [facility] on [address] at [facility]  and [address]. You are invited to use the FREE valet parking or use the Visitor's parking deck.  Pick up the phone at the desk and dial [phone].  Call this number if you have problems the morning of surgery: [phone]  Remember:   Do not eat food:(After Midnight) Desps de medianoche.  Do not drink clear liquids: (After Midnight) Desps de medianoche.  Take these medicines the morning of surgery with A SIP OF WATER:  none   Do not wear jewelry, make-up or nail polish.  Do not wear lotions, powders, or perfumes. Do not wear deodorant.  Do not shave 48 hours prior to surgery.  Do not bring valuables to the hospital.  [facility] is not   responsible for any belongings or valuables brought to the hospital.  Contacts, dentures or bridgework may not be worn into surgery.  Leave suitcase in the car. After surgery it may be brought to your room.  For patients admitted to the hospital, checkout time is 11:00 AM the day of              discharge.      Please read over the following fact sheets that you were given:     Preparing for Surgery

## 2019-07-18 ENCOUNTER — Telehealth: Payer: Self-pay | Admitting: Family Medicine

## 2019-07-18 ENCOUNTER — Ambulatory Visit (HOSPITAL_COMMUNITY)
Admission: RE | Admit: 2019-07-18 | Discharge: 2019-07-18 | Disposition: A | Discharge status: Home / Self Care | Payer: BC Managed Care – PPO | Source: Ambulatory Visit | Attending: Obstetrics and Gynecology | Admitting: Obstetrics and Gynecology

## 2019-07-18 ENCOUNTER — Other Ambulatory Visit: Payer: Self-pay

## 2019-07-18 ENCOUNTER — Encounter (INDEPENDENT_AMBULATORY_CARE_PROVIDER_SITE_OTHER): Payer: Self-pay

## 2019-07-18 ENCOUNTER — Ambulatory Visit (HOSPITAL_COMMUNITY): Payer: BC Managed Care – PPO | Admitting: *Deleted

## 2019-07-18 ENCOUNTER — Encounter (HOSPITAL_COMMUNITY): Payer: Self-pay

## 2019-07-18 VITALS — BP 126/83 | HR 98 | Temp 98.1°F

## 2019-07-18 DIAGNOSIS — Z362 Encounter for other antenatal screening follow-up: Secondary | ICD-10-CM | POA: Diagnosis not present

## 2019-07-18 DIAGNOSIS — Q513 Bicornate uterus: Secondary | ICD-10-CM

## 2019-07-18 DIAGNOSIS — O359XX0 Maternal care for (suspected) fetal abnormality and damage, unspecified, not applicable or unspecified: Secondary | ICD-10-CM | POA: Insufficient documentation

## 2019-07-18 DIAGNOSIS — O360130 Maternal care for anti-D [Rh] antibodies, third trimester, not applicable or unspecified: Secondary | ICD-10-CM

## 2019-07-18 DIAGNOSIS — O368130 Decreased fetal movements, third trimester, not applicable or unspecified: Secondary | ICD-10-CM

## 2019-07-18 DIAGNOSIS — O0990 Supervision of high risk pregnancy, unspecified, unspecified trimester: Secondary | ICD-10-CM

## 2019-07-18 DIAGNOSIS — O34593 Maternal care for other abnormalities of gravid uterus, third trimester: Secondary | ICD-10-CM

## 2019-07-18 DIAGNOSIS — Z3A36 36 weeks gestation of pregnancy: Secondary | ICD-10-CM

## 2019-07-18 NOTE — Telephone Encounter (Signed)
The patient stated she was told she has to have a shot for her child's lungs being that she is having him at 37 weeks. She also stated she was told she is to have a tetanus shot. The week she was scheduled to have it she stated she had covid and would like to know if she can talk to someone about it.

## 2019-07-19 ENCOUNTER — Encounter (INDEPENDENT_AMBULATORY_CARE_PROVIDER_SITE_OTHER): Payer: Self-pay

## 2019-07-20 ENCOUNTER — Encounter (INDEPENDENT_AMBULATORY_CARE_PROVIDER_SITE_OTHER): Payer: Self-pay

## 2019-07-21 ENCOUNTER — Encounter (INDEPENDENT_AMBULATORY_CARE_PROVIDER_SITE_OTHER): Payer: Self-pay

## 2019-07-22 ENCOUNTER — Encounter (INDEPENDENT_AMBULATORY_CARE_PROVIDER_SITE_OTHER): Payer: Self-pay

## 2019-07-23 NOTE — Telephone Encounter (Signed)
Called pt to discuss Tdap and Betamethasone. Pt is to discuss with provider at virtual visit tomorrow. Pt has not had her 36 week swabs yet. Pt has been on quarantine due to Covid. Pt was notified that she needs to discuss with provider tomorrow. Pt scheduled to deliver on Friday. Reviewed with Ralene Bathe, RN who suggested pt keep her Virtual visit and then can be schedule in person if provider wishes.

## 2019-07-24 ENCOUNTER — Other Ambulatory Visit: Payer: Self-pay

## 2019-07-24 ENCOUNTER — Encounter: Payer: Self-pay | Admitting: Obstetrics and Gynecology

## 2019-07-24 ENCOUNTER — Telehealth (INDEPENDENT_AMBULATORY_CARE_PROVIDER_SITE_OTHER): Payer: BC Managed Care – PPO | Admitting: Obstetrics and Gynecology

## 2019-07-24 VITALS — BP 127/83 | HR 87

## 2019-07-24 DIAGNOSIS — O360930 Maternal care for other rhesus isoimmunization, third trimester, not applicable or unspecified: Secondary | ICD-10-CM

## 2019-07-24 DIAGNOSIS — Z3A36 36 weeks gestation of pregnancy: Secondary | ICD-10-CM

## 2019-07-24 DIAGNOSIS — Q513 Bicornate uterus: Secondary | ICD-10-CM

## 2019-07-24 DIAGNOSIS — O26893 Other specified pregnancy related conditions, third trimester: Secondary | ICD-10-CM

## 2019-07-24 DIAGNOSIS — U071 COVID-19: Secondary | ICD-10-CM

## 2019-07-24 DIAGNOSIS — O3403 Maternal care for unspecified congenital malformation of uterus, third trimester: Secondary | ICD-10-CM

## 2019-07-24 DIAGNOSIS — O26899 Other specified pregnancy related conditions, unspecified trimester: Secondary | ICD-10-CM

## 2019-07-24 DIAGNOSIS — O98513 Other viral diseases complicating pregnancy, third trimester: Secondary | ICD-10-CM

## 2019-07-24 DIAGNOSIS — Z6791 Unspecified blood type, Rh negative: Secondary | ICD-10-CM

## 2019-07-24 DIAGNOSIS — O0993 Supervision of high risk pregnancy, unspecified, third trimester: Secondary | ICD-10-CM

## 2019-07-24 DIAGNOSIS — O0990 Supervision of high risk pregnancy, unspecified, unspecified trimester: Secondary | ICD-10-CM

## 2019-07-24 NOTE — Progress Notes (Signed)
I connected with  Amanda Clements on 07/24/19 at 11:15 AM EST by telephone and verified that I am speaking with the correct person using two identifiers.   I discussed the limitations, risks, security and privacy concerns of performing an evaluation and management service by telephone and the availability of in person appointments. I also discussed with the patient that there may be a patient responsible charge related to this service. The patient expressed understanding and agreed to proceed.  Henrietta Dine, CMA 07/24/2019  11:19 AM
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
## 2019-07-24 NOTE — Progress Notes (Signed)
   TELEHEALTH OBSTETRICS PRENATAL VIRTUAL VIDEO VISIT ENCOUNTER NOTE  Provider location: [facility] [employer] [facility] [place]   I connected with Amanda Clements on 07/24/19 at 11:15 AM EST by MyChart Video Encounter at home and verified that I am speaking with the correct person using two identifiers.   I discussed the limitations, risks, security and privacy concerns of performing an evaluation and management service virtually and the availability of in person appointments. I also discussed with the patient that there may be a patient responsible charge related to this service. The patient expressed understanding and agreed to proceed. Subjective:  Amanda Clements is a 24 y.o. G2P0010 at [redacted]w[redacted]d being seen today for ongoing prenatal care.  She is currently monitored for the following issues for this high-risk pregnancy and has Bicornate uterus complicating pregnancy; Endometriosis; Rh negative, antepartum; Supervision of high risk pregnancy, antepartum; and COVID-19 affecting pregnancy, antepartum on their problem list.  Patient reports no complaints. Has stuffy nose, otherwise feeling well.  Contractions: Not present. Vag. Bleeding: None.  Movement: Present. Denies any leaking of fluid.   The following portions of the patient's history were reviewed and updated as appropriate: allergies, current medications, past family history, past medical history, past social history, past surgical history and problem list.   Objective:   Vitals:   07/24/19 1120  BP: 127/83  Pulse: 87   Fetal Status:     Movement: Present     General:  Alert, oriented and cooperative. Patient is in no acute distress.  Respiratory: Normal respiratory effort, no problems with respiration noted  Mental Status: Normal mood and affect. Normal behavior. Normal judgment and thought content.  Rest of physical exam deferred due to type of encounter  Imaging:  Assessment and Plan:  Pregnancy: G2P0010 at [redacted]w[redacted]d  1. Supervision of  high risk pregnancy, antepartum  2. COVID-19 affecting pregnancy, antepartum Dx 07/05/19  3. Rh negative, antepartum Rho gam workup pp  4. 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 I discussed the assessment and treatment plan with the patient. The patient was provided an opportunity to ask questions and all were answered. The patient agreed with the plan and demonstrated an understanding of the instructions. The patient was advised to call back or seek an in-person office evaluation/go to MAU at [facility] for any urgent or concerning symptoms. Please refer to After Visit Summary for other counseling recommendations.   I provided 15 minutes of face-to-face time during this encounter.  Return in about 5 weeks (around 08/28/2019) for post partum check.  Future Appointments  Date Time Provider Department Center  07/25/2019  8:40 AM [facility] 1 [facility] None  08/06/2019  2:15 PM Levie Heritage, DO [facility]    Conan Bowens, MD [facility] [employer], [facility]

## 2019-07-25 ENCOUNTER — Other Ambulatory Visit: Payer: Self-pay

## 2019-07-25 ENCOUNTER — Other Ambulatory Visit (HOSPITAL_COMMUNITY)
Admission: RE | Admit: 2019-07-25 | Discharge: 2019-07-25 | Disposition: A | Discharge status: Home / Self Care | Payer: BC Managed Care – PPO | Source: Ambulatory Visit | Attending: Obstetrics and Gynecology | Admitting: Obstetrics and Gynecology

## 2019-07-25 DIAGNOSIS — Z98891 History of uterine scar from previous surgery: Secondary | ICD-10-CM | POA: Insufficient documentation

## 2019-07-25 DIAGNOSIS — Z01812 Encounter for preprocedural laboratory examination: Secondary | ICD-10-CM | POA: Insufficient documentation

## 2019-07-25 DIAGNOSIS — O321XX0 Maternal care for breech presentation, not applicable or unspecified: Secondary | ICD-10-CM | POA: Diagnosis not present

## 2019-07-25 DIAGNOSIS — Z3A37 37 weeks gestation of pregnancy: Secondary | ICD-10-CM | POA: Diagnosis not present

## 2019-07-25 LAB — CBC
HCT: 37.6 % (ref 36.0–46.0)
Hemoglobin: 12.3 g/dL (ref 12.0–15.0)
MCH: 29.4 pg (ref 26.0–34.0)
MCHC: 32.7 g/dL (ref 30.0–36.0)
MCV: 89.7 fL (ref 80.0–100.0)
Platelets: 290 10*3/uL (ref 150–400)
RBC: 4.19 MIL/uL (ref 3.87–5.11)
RDW: 15.5 % (ref 11.5–15.5)
WBC: 12.3 10*3/uL — ABNORMAL HIGH (ref 4.0–10.5)
nRBC: 0.2 % (ref 0.0–0.2)

## 2019-07-25 LAB — ABO/RH: ABO/RH(D): B NEG

## 2019-07-25 NOTE — MAU Note (Signed)
Prior covid +, told would not need retesting.  Lab called for bloodwork

## 2019-07-26 NOTE — Anesthesia Preprocedure Evaluation (Signed)
Anesthesia Evaluation  Patient identified by MRN, date of birth, ID band Patient awake    Reviewed: Allergy & Precautions, H&P , NPO status , Patient's Chart, lab work & pertinent test results, reviewed documented beta blocker date and time   Airway Mallampati: II  TM Distance: >3 FB Neck ROM: full    Dental no notable dental hx.    Pulmonary neg pulmonary ROS,    Pulmonary exam normal breath sounds clear to auscultation       Cardiovascular negative cardio ROS Normal cardiovascular exam Rhythm:regular Rate:Normal     Neuro/Psych negative neurological ROS  negative psych ROS   GI/Hepatic negative GI ROS, Neg liver ROS,   Endo/Other  negative endocrine ROS  Renal/GU negative Renal ROS  negative genitourinary   Musculoskeletal   Abdominal   Peds  Hematology negative hematology ROS (+)   Anesthesia Other Findings   Reproductive/Obstetrics (+) Pregnancy                             Anesthesia Physical Anesthesia Plan  ASA: II  Anesthesia Plan: Spinal   Post-op Pain Management:    Induction:   PONV Risk Score and Plan: 2  Airway Management Planned: Natural Airway  Additional Equipment:   Intra-op Plan:   Post-operative Plan:   Informed Consent: I have reviewed the patients History and Physical, chart, labs and discussed the procedure including the risks, benefits and alternatives for the proposed anesthesia with the patient or authorized representative who has indicated his/her understanding and acceptance.     Dental Advisory Given  Plan Discussed with: Anesthesiologist, CRNA and Surgeon  Anesthesia Plan Comments: (  )        Anesthesia Quick Evaluation

## 2019-07-27 ENCOUNTER — Inpatient Hospital Stay (HOSPITAL_COMMUNITY): Payer: BC Managed Care – PPO | Admitting: Anesthesiology

## 2019-07-27 ENCOUNTER — Encounter (HOSPITAL_COMMUNITY): Payer: Self-pay | Admitting: Family Medicine

## 2019-07-27 ENCOUNTER — Other Ambulatory Visit: Payer: Self-pay

## 2019-07-27 ENCOUNTER — Inpatient Hospital Stay (HOSPITAL_COMMUNITY)
Admission: RE | Admit: 2019-07-27 | Discharge: 2019-07-29 | DRG: 788 | Disposition: A | Discharge status: Home / Self Care | Payer: BC Managed Care – PPO | Attending: Family Medicine | Admitting: Family Medicine

## 2019-07-27 ENCOUNTER — Encounter (HOSPITAL_COMMUNITY)
Admission: RE | Disposition: A | Discharge status: Home / Self Care | Payer: Self-pay | Source: Home / Self Care | Attending: Family Medicine

## 2019-07-27 DIAGNOSIS — Q513 Bicornate uterus: Secondary | ICD-10-CM

## 2019-07-27 DIAGNOSIS — O26893 Other specified pregnancy related conditions, third trimester: Secondary | ICD-10-CM | POA: Diagnosis not present

## 2019-07-27 DIAGNOSIS — D649 Anemia, unspecified: Secondary | ICD-10-CM | POA: Diagnosis not present

## 2019-07-27 DIAGNOSIS — Z90410 Acquired total absence of pancreas: Secondary | ICD-10-CM

## 2019-07-27 DIAGNOSIS — Z3A37 37 weeks gestation of pregnancy: Secondary | ICD-10-CM

## 2019-07-27 DIAGNOSIS — O328XX0 Maternal care for other malpresentation of fetus, not applicable or unspecified: Secondary | ICD-10-CM | POA: Diagnosis not present

## 2019-07-27 DIAGNOSIS — Z6791 Unspecified blood type, Rh negative: Secondary | ICD-10-CM | POA: Diagnosis not present

## 2019-07-27 DIAGNOSIS — Z8616 Personal history of COVID-19: Secondary | ICD-10-CM | POA: Diagnosis present

## 2019-07-27 DIAGNOSIS — O3429 Maternal care due to uterine scar from other previous surgery: Secondary | ICD-10-CM

## 2019-07-27 DIAGNOSIS — O26899 Other specified pregnancy related conditions, unspecified trimester: Secondary | ICD-10-CM

## 2019-07-27 DIAGNOSIS — O3403 Maternal care for unspecified congenital malformation of uterus, third trimester: Principal | ICD-10-CM | POA: Diagnosis present

## 2019-07-27 DIAGNOSIS — Z87718 Personal history of other specified (corrected) congenital malformations of genitourinary system: Secondary | ICD-10-CM

## 2019-07-27 DIAGNOSIS — O321XX0 Maternal care for breech presentation, not applicable or unspecified: Secondary | ICD-10-CM | POA: Diagnosis not present

## 2019-07-27 DIAGNOSIS — O34211 Maternal care for low transverse scar from previous cesarean delivery: Secondary | ICD-10-CM | POA: Diagnosis not present

## 2019-07-27 DIAGNOSIS — Z9081 Acquired absence of spleen: Secondary | ICD-10-CM

## 2019-07-27 DIAGNOSIS — N809 Endometriosis, unspecified: Secondary | ICD-10-CM | POA: Diagnosis present

## 2019-07-27 DIAGNOSIS — Z412 Encounter for routine and ritual male circumcision: Secondary | ICD-10-CM | POA: Diagnosis not present

## 2019-07-27 DIAGNOSIS — O9902 Anemia complicating childbirth: Secondary | ICD-10-CM | POA: Diagnosis not present

## 2019-07-27 DIAGNOSIS — Z23 Encounter for immunization: Secondary | ICD-10-CM | POA: Diagnosis not present

## 2019-07-27 LAB — CREATININE, SERUM
Creatinine, Ser: 0.62 mg/dL (ref 0.44–1.00)
GFR calc Af Amer: >60 mL/min (ref >60)
GFR calc non Af Amer: >60 mL/min (ref >60)

## 2019-07-27 LAB — CBC
HCT: 33.8 % — ABNORMAL LOW (ref 36.0–46.0)
Hemoglobin: 11.2 g/dL — ABNORMAL LOW (ref 12.0–15.0)
MCH: 29.7 pg (ref 26.0–34.0)
MCHC: 33.1 g/dL (ref 30.0–36.0)
MCV: 89.7 fL (ref 80.0–100.0)
Platelets: 283 10*3/uL (ref 150–400)
RBC: 3.77 MIL/uL — ABNORMAL LOW (ref 3.87–5.11)
RDW: 15.6 % — ABNORMAL HIGH (ref 11.5–15.5)
WBC: 17.4 10*3/uL — ABNORMAL HIGH (ref 4.0–10.5)
nRBC: 0 % (ref 0.0–0.2)

## 2019-07-27 SURGERY — Surgical Case
Anesthesia: Spinal | Wound class: Clean Contaminated

## 2019-07-27 MED ORDER — SIMETHICONE 80 MG PO CHEW: 80.0000 mg | CHEWABLE_TABLET | ORAL | Status: DC | PRN

## 2019-07-27 MED ORDER — ONDANSETRON HCL 4 MG/2ML IJ SOLN
INTRAMUSCULAR | Status: DC | PRN
  Administered 2019-07-27: 4 mg via INTRAVENOUS

## 2019-07-27 MED ORDER — PHENYLEPHRINE HCL-NACL 20-0.9 MG/250ML-% IV SOLN
INTRAVENOUS | Status: DC | PRN
  Administered 2019-07-27: 60 ug/min via INTRAVENOUS

## 2019-07-27 MED ORDER — DIPHENHYDRAMINE HCL 25 MG PO CAPS: 25.0000 mg | ORAL_CAPSULE | Freq: Four times a day (QID) | ORAL | Status: DC | PRN

## 2019-07-27 MED ORDER — OXYCODONE HCL 5 MG PO TABS
5.0000 mg | ORAL_TABLET | ORAL | Status: DC | PRN
  Administered 2019-07-28: 5 mg via ORAL
  Administered 2019-07-28: 10 mg via ORAL
  Administered 2019-07-29: 5 mg via ORAL
  Filled 2019-07-27 (×2): qty 1
  Filled 2019-07-27: qty 2

## 2019-07-27 MED ORDER — ACETAMINOPHEN 500 MG PO TABS
1000.0000 mg | ORAL_TABLET | ORAL | Status: AC
  Administered 2019-07-27: 1000 mg via ORAL

## 2019-07-27 MED ORDER — SIMETHICONE 80 MG PO CHEW
80.0000 mg | CHEWABLE_TABLET | Freq: Three times a day (TID) | ORAL | Status: DC
  Administered 2019-07-27 – 2019-07-29 (×5): 80 mg via ORAL
  Filled 2019-07-27 (×5): qty 1

## 2019-07-27 MED ORDER — BUPIVACAINE IN DEXTROSE 0.75-8.25 % IT SOLN
INTRATHECAL | Status: DC | PRN
  Administered 2019-07-27: 1.6 mL via INTRATHECAL

## 2019-07-27 MED ORDER — SODIUM CHLORIDE 0.9 % IV SOLN: INTRAVENOUS | Status: DC | PRN

## 2019-07-27 MED ORDER — SOD CITRATE-CITRIC ACID 500-334 MG/5ML PO SOLN
30.0000 mL | ORAL | Status: AC
  Administered 2019-07-27: 30 mL via ORAL

## 2019-07-27 MED ORDER — BUPIVACAINE HCL (PF) 0.25 % IJ SOLN
INTRAMUSCULAR | Status: AC
  Filled 2019-07-27: qty 30

## 2019-07-27 MED ORDER — OXYCODONE HCL 5 MG PO TABS: 5.0000 mg | ORAL_TABLET | Freq: Once | ORAL | Status: DC | PRN

## 2019-07-27 MED ORDER — FENTANYL CITRATE (PF) 100 MCG/2ML IJ SOLN
INTRAMUSCULAR | Status: DC | PRN
  Administered 2019-07-27: 15 ug via INTRATHECAL

## 2019-07-27 MED ORDER — OXYTOCIN 40 UNITS IN NORMAL SALINE INFUSION - SIMPLE MED
INTRAVENOUS | Status: DC | PRN
  Administered 2019-07-27: 40 mL via INTRAVENOUS

## 2019-07-27 MED ORDER — TETANUS-DIPHTH-ACELL PERTUSSIS 5-2.5-18.5 LF-MCG/0.5 IM SUSP: 0.5000 mL | Freq: Once | INTRAMUSCULAR | Status: DC

## 2019-07-27 MED ORDER — OXYCODONE HCL 5 MG/5ML PO SOLN: 5.0000 mg | Freq: Once | ORAL | Status: DC | PRN

## 2019-07-27 MED ORDER — SIMETHICONE 80 MG PO CHEW
80.0000 mg | CHEWABLE_TABLET | ORAL | Status: DC
  Administered 2019-07-27 – 2019-07-28 (×2): 80 mg via ORAL
  Filled 2019-07-27 (×2): qty 1

## 2019-07-27 MED ORDER — MEPERIDINE HCL 25 MG/ML IJ SOLN: 6.2500 mg | INTRAMUSCULAR | Status: DC | PRN

## 2019-07-27 MED ORDER — ZOLPIDEM TARTRATE 5 MG PO TABS: 5.0000 mg | ORAL_TABLET | Freq: Every evening | ORAL | Status: DC | PRN

## 2019-07-27 MED ORDER — LACTATED RINGERS IV SOLN: INTRAVENOUS | Status: DC

## 2019-07-27 MED ORDER — WITCH HAZEL-GLYCERIN EX PADS: 1.0000 "application " | MEDICATED_PAD | CUTANEOUS | Status: DC | PRN

## 2019-07-27 MED ORDER — CEFAZOLIN SODIUM-DEXTROSE 2-4 GM/100ML-% IV SOLN
INTRAVENOUS | Status: AC
  Filled 2019-07-27: qty 100

## 2019-07-27 MED ORDER — SOD CITRATE-CITRIC ACID 500-334 MG/5ML PO SOLN
ORAL | Status: AC
  Filled 2019-07-27: qty 30

## 2019-07-27 MED ORDER — ENOXAPARIN SODIUM 60 MG/0.6ML ~~LOC~~ SOLN
50.0000 mg | SUBCUTANEOUS | Status: DC
  Administered 2019-07-28 – 2019-07-29 (×2): 50 mg via SUBCUTANEOUS
  Filled 2019-07-27 (×2): qty 0.6

## 2019-07-27 MED ORDER — STERILE WATER FOR IRRIGATION IR SOLN
Status: DC | PRN
  Administered 2019-07-27: 1000 mL

## 2019-07-27 MED ORDER — ACETAMINOPHEN 500 MG PO TABS
ORAL_TABLET | ORAL | Status: AC
  Filled 2019-07-27: qty 2

## 2019-07-27 MED ORDER — OXYTOCIN 40 UNITS IN NORMAL SALINE INFUSION - SIMPLE MED: 2.5000 [IU]/h | INTRAVENOUS | Status: AC

## 2019-07-27 MED ORDER — PRENATAL MULTIVITAMIN CH
1.0000 | ORAL_TABLET | Freq: Every day | ORAL | Status: DC
  Administered 2019-07-28: 1 via ORAL
  Filled 2019-07-27: qty 1

## 2019-07-27 MED ORDER — ACETAMINOPHEN 325 MG PO TABS: 325.0000 mg | ORAL_TABLET | ORAL | Status: DC | PRN

## 2019-07-27 MED ORDER — ONDANSETRON HCL 4 MG/2ML IJ SOLN: 4.0000 mg | Freq: Once | INTRAMUSCULAR | Status: DC | PRN

## 2019-07-27 MED ORDER — SENNOSIDES-DOCUSATE SODIUM 8.6-50 MG PO TABS
2.0000 | ORAL_TABLET | ORAL | Status: DC
  Administered 2019-07-27 – 2019-07-28 (×2): 2 via ORAL
  Filled 2019-07-27 (×2): qty 2

## 2019-07-27 MED ORDER — DIBUCAINE (PERIANAL) 1 % EX OINT: 1.0000 "application " | TOPICAL_OINTMENT | CUTANEOUS | Status: DC | PRN

## 2019-07-27 MED ORDER — FENTANYL CITRATE (PF) 100 MCG/2ML IJ SOLN: 25.0000 ug | INTRAMUSCULAR | Status: DC | PRN

## 2019-07-27 MED ORDER — BUPIVACAINE HCL (PF) 0.25 % IJ SOLN
INTRAMUSCULAR | Status: DC | PRN
  Administered 2019-07-27: 30 mL

## 2019-07-27 MED ORDER — COCONUT OIL OIL: 1.0000 "application " | TOPICAL_OIL | Status: DC | PRN

## 2019-07-27 MED ORDER — ONDANSETRON HCL 4 MG/2ML IJ SOLN
INTRAMUSCULAR | Status: AC
  Filled 2019-07-27: qty 2

## 2019-07-27 MED ORDER — GABAPENTIN 300 MG PO CAPS
ORAL_CAPSULE | ORAL | Status: AC
  Filled 2019-07-27: qty 1

## 2019-07-27 MED ORDER — CEFAZOLIN SODIUM-DEXTROSE 2-4 GM/100ML-% IV SOLN
2.0000 g | INTRAVENOUS | Status: AC
  Administered 2019-07-27: 2 g via INTRAVENOUS

## 2019-07-27 MED ORDER — SODIUM CHLORIDE 0.9 % IR SOLN
Status: DC | PRN
  Administered 2019-07-27: 1000 mL

## 2019-07-27 MED ORDER — FENTANYL CITRATE (PF) 100 MCG/2ML IJ SOLN
INTRAMUSCULAR | Status: AC
  Filled 2019-07-27: qty 2

## 2019-07-27 MED ORDER — IBUPROFEN 800 MG PO TABS
800.0000 mg | ORAL_TABLET | Freq: Three times a day (TID) | ORAL | Status: DC
  Administered 2019-07-27 – 2019-07-29 (×6): 800 mg via ORAL
  Filled 2019-07-27 (×6): qty 1

## 2019-07-27 MED ORDER — ACETAMINOPHEN 160 MG/5ML PO SOLN: 325.0000 mg | ORAL | Status: DC | PRN

## 2019-07-27 MED ORDER — ACETAMINOPHEN 325 MG PO TABS
650.0000 mg | ORAL_TABLET | Freq: Four times a day (QID) | ORAL | Status: DC | PRN
  Administered 2019-07-27 – 2019-07-29 (×4): 650 mg via ORAL
  Filled 2019-07-27 (×4): qty 2

## 2019-07-27 MED ORDER — GABAPENTIN 300 MG PO CAPS
300.0000 mg | ORAL_CAPSULE | ORAL | Status: AC
  Administered 2019-07-27: 300 mg via ORAL

## 2019-07-27 MED ORDER — PHENYLEPHRINE HCL-NACL 20-0.9 MG/250ML-% IV SOLN
INTRAVENOUS | Status: AC
  Filled 2019-07-27: qty 250

## 2019-07-27 MED ORDER — OXYTOCIN 40 UNITS IN NORMAL SALINE INFUSION - SIMPLE MED
INTRAVENOUS | Status: AC
  Filled 2019-07-27: qty 1000

## 2019-07-27 MED ORDER — ENOXAPARIN SODIUM 40 MG/0.4ML ~~LOC~~ SOLN: 40.0000 mg | SUBCUTANEOUS | Status: DC

## 2019-07-27 MED ORDER — MENTHOL 3 MG MT LOZG: 1.0000 | LOZENGE | OROMUCOSAL | Status: DC | PRN

## 2019-07-27 MED ORDER — MORPHINE SULFATE (PF) 0.5 MG/ML IJ SOLN
INTRAMUSCULAR | Status: DC | PRN
  Administered 2019-07-27: .15 mg via INTRATHECAL

## 2019-07-27 MED ORDER — MORPHINE SULFATE (PF) 0.5 MG/ML IJ SOLN
INTRAMUSCULAR | Status: AC
  Filled 2019-07-27: qty 10

## 2019-07-27 SURGICAL SUPPLY — 38 items
BAG DECANTER FOR FLEXI CONT (MISCELLANEOUS) ×3 IMPLANT
BENZOIN TINCTURE PRP APPL 2/3 (GAUZE/BANDAGES/DRESSINGS) ×3 IMPLANT
CHLORAPREP W/TINT 26ML (MISCELLANEOUS) ×3 IMPLANT
CLAMP CORD UMBIL (MISCELLANEOUS) IMPLANT
CLOSURE WOUND 1/2 X4 (GAUZE/BANDAGES/DRESSINGS) ×1
CLOTH BEACON ORANGE TIMEOUT ST (SAFETY) ×3 IMPLANT
DECANTER SPIKE VIAL GLASS SM (MISCELLANEOUS) ×3 IMPLANT
DRSG OPSITE POSTOP 4X10 (GAUZE/BANDAGES/DRESSINGS) ×3 IMPLANT
ELECT REM PT RETURN 9FT ADLT (ELECTROSURGICAL) ×3
ELECTRODE REM PT RTRN 9FT ADLT (ELECTROSURGICAL) ×1 IMPLANT
EXTRACTOR VACUUM M CUP 4 TUBE (SUCTIONS) IMPLANT
EXTRACTOR VACUUM M CUP 4' TUBE (SUCTIONS)
GAUZE SPONGE 4X4 12PLY STRL LF (GAUZE/BANDAGES/DRESSINGS) ×6 IMPLANT
GLOVE BIOGEL PI IND STRL 7.0 (GLOVE) ×2 IMPLANT
GLOVE BIOGEL PI INDICATOR 7.0 (GLOVE) ×4
GLOVE ECLIPSE 7.0 STRL STRAW (GLOVE) ×6 IMPLANT
GOWN STRL REUS W/TWL LRG LVL3 (GOWN DISPOSABLE) ×6 IMPLANT
KIT ABG SYR 3ML LUER SLIP (SYRINGE) IMPLANT
NEEDLE HYPO 22GX1.5 SAFETY (NEEDLE) ×3 IMPLANT
NEEDLE HYPO 25X5/8 SAFETYGLIDE (NEEDLE) IMPLANT
NS IRRIG 1000ML POUR BTL (IV SOLUTION) ×3 IMPLANT
PACK C SECTION WH (CUSTOM PROCEDURE TRAY) ×3 IMPLANT
PAD ABD 7.5X8 STRL (GAUZE/BANDAGES/DRESSINGS) ×3 IMPLANT
PAD OB MATERNITY 4.3X12.25 (PERSONAL CARE ITEMS) ×3 IMPLANT
PENCIL SMOKE EVAC W/HOLSTER (ELECTROSURGICAL) ×3 IMPLANT
RTRCTR C-SECT PINK 25CM LRG (MISCELLANEOUS) ×3 IMPLANT
SPONGE GAUZE 4X4 12PLY STER LF (GAUZE/BANDAGES/DRESSINGS) ×3 IMPLANT
STRIP CLOSURE SKIN 1/2X4 (GAUZE/BANDAGES/DRESSINGS) ×2 IMPLANT
SUT MNCRL 0 VIOLET CTX 36 (SUTURE) ×2 IMPLANT
SUT MONOCRYL 0 CTX 36 (SUTURE) ×4
SUT PLAIN 2 0 XLH (SUTURE) ×3 IMPLANT
SUT VIC AB 0 CTX 36 (SUTURE) ×2
SUT VIC AB 0 CTX36XBRD ANBCTRL (SUTURE) ×1 IMPLANT
SUT VIC AB 4-0 KS 27 (SUTURE) ×3 IMPLANT
SYR 30ML LL (SYRINGE) ×3 IMPLANT
TOWEL OR 17X24 6PK STRL BLUE (TOWEL DISPOSABLE) ×3 IMPLANT
TRAY FOLEY W/BAG SLVR 14FR LF (SET/KITS/TRAYS/PACK) ×3 IMPLANT
WATER STERILE IRR 1000ML POUR (IV SOLUTION) ×3 IMPLANT

## 2019-07-27 NOTE — Op Note (Signed)
Amanda Clements PROCEDURE DATE: 07/27/2019  PREOPERATIVE DIAGNOSES: Intrauterine pregnancy at [redacted]w[redacted]d weeks gestation; previous uterine surgery; breech presentation  POSTOPERATIVE DIAGNOSES: The same  PROCEDURE: Primary Low Transverse Cesarean Section  SURGEON:  Dr. Darron Doom - Primary Barrington Ellison, MD - Fellow  ASSISTANT:  An experienced assistant was required given the standard of surgical care given the complexity of the case.  This assistant was needed for exposure, dissection, suctioning, retraction, instrument exchange, assisting with delivery with administration of fundal pressure and for overall help during the procedure.  ANESTHESIOLOGY TEAM: Anesthesiologist: Janeece Riggers, MD CRNA: Hewitt Blade, CRNA  INDICATIONS: Amanda Clements is a 24 y.o. G2P1011 at [redacted]w[redacted]d here for cesarean section secondary to the indications listed under preoperative diagnoses; please see preoperative note for further details.  The risks of cesarean section were discussed with the patient including but were not limited to: bleeding which may require transfusion or reoperation; infection which may require antibiotics; injury to bowel, bladder, ureters or other surrounding organs; injury to the fetus; need for additional procedures including hysterectomy in the event of a life-threatening hemorrhage; placental abnormalities wth subsequent pregnancies, incisional problems, thromboembolic phenomenon and other postoperative/anesthesia complications.   The patient concurred with the proposed plan, giving informed written consent for the procedure.    FINDINGS:  Viable female infant in double footling breech presentation. Clear amniotic fluid.  Intact placenta, three vessel cord.  Normal uterus, fallopian tubes and ovaries on left side; absent on right side. Overall paucity of scar tissue and adhesions.  APGAR (1 MIN): 8   APGAR (5 MINS): 9   APGAR (10 MINS):    ANESTHESIA: Spinal INTRAVENOUS FLUIDS: 2800 ml    ESTIMATED BLOOD LOSS: 412 ml URINE OUTPUT:  100 ml SPECIMENS: Placenta sent to L&D COMPLICATIONS: None immediate  PROCEDURE IN DETAIL:  The patient preoperatively received intravenous antibiotics and had sequential compression devices applied to her lower extremities.  She was then taken to the operating room where spinal anesthesia was administered and was found to be adequate. She was then placed in a dorsal supine position with a leftward tilt, and prepped and draped in a sterile manner.  A foley catheter was placed into her bladder and attached to constant gravity.  After an adequate timeout was performed, a Pfannenstiel skin incision was made with scalpel on her preexisting scarand carried through to the underlying layer of fascia. The fascia was incised in the midline, and this incision was extended bilaterally using the Mayo scissors.  Kocher clamps were applied to the superior aspect of the fascial incision and the underlying rectus muscles were dissected off bluntly and sharply.  A similar process was carried out on the inferior aspect of the fascial incision. The rectus muscles were separated in the midline and the peritoneum was entered sharply and then bluntly. Further dissection with Bovie of right-sided rectus muscles for better exposure utilized. The Alexis self-retaining retractor was introduced into the abdominal cavity.  Attention was turned to the lower uterine segment where a low transverse hysterotomy was made with a scalpel and extended bilaterally bluntly.  The infant was successfully delivered from double footling breech presentation, the cord was clamped and cut after one minute, and the infant was handed over to the awaiting neonatology team. Uterine massage was then administered, and the placenta delivered intact with a three-vessel cord. The uterus was then cleared of clots and debris.  The hysterotomy was closed with 0 Chromic in a running locked fashion, and an imbricating layer  was  also placed with 0 Chromic.  Figure-of-eight 0 Chromic serosal stitches were placed to help with hemostasis.  The pelvis was cleared of all clot and debris. Hemostasis was confirmed on all surfaces.  The retractor was removed.  The peritoneum was closed with a 0 Chromic running stitch. The fascia was then closed using 0 Vicryl in a running fashion.  The subcutaneous layer was irrigated, reapproximated with 2-0 plain gut interrupted stitches, and the skin was closed with a 4-0 Vicryl subcuticular stitch. The patient tolerated the procedure well. 30 mL 0.25% Marcaine injected around incision site.  Sponge, instrument and needle counts were correct x 3.  She was taken to the recovery room in stable condition.   Jerilynn Birkenhead, MD [facility] Fellow, [facility] [employer], [facility]

## 2019-07-27 NOTE — Anesthesia Postprocedure Evaluation (Signed)
Anesthesia Post Note  Patient: Daveda Larock  Procedure(s) Performed: CESAREAN SECTION (N/A )     Patient location during evaluation: PACU Anesthesia Type: Spinal Level of consciousness: oriented and awake and alert Pain management: pain level controlled Vital Signs Assessment: post-procedure vital signs reviewed and stable Respiratory status: spontaneous breathing, respiratory function stable and patient connected to nasal cannula oxygen Cardiovascular status: blood pressure returned to baseline and stable Postop Assessment: no headache, no backache and no apparent nausea or vomiting Anesthetic complications: no    Last Vitals:  Vitals:   07/27/19 0755 07/27/19 1104  BP: 119/87 111/66  Pulse: 84   Resp: 16 13  Temp: 37 C 36.5 C  SpO2: 97%     Last Pain:  Vitals:   07/27/19 1104  TempSrc: Oral   Pain Goal:    LLE Motor Response: No movement due to regional block (07/27/19 1104) LLE Sensation: No sensation (absent) (07/27/19 1104) RLE Motor Response: No movement due to regional block (07/27/19 1104) RLE Sensation: No sensation (absent) (07/27/19 1104)     Epidural/Spinal Function Cutaneous sensation: No Sensation (07/27/19 1104), Patient able to flex knees: No (07/27/19 1104), Patient able to lift hips off bed: No (07/27/19 1104), Back pain beyond tenderness at insertion site: No (07/27/19 1104), Progressively worsening motor and/or sensory loss: No (07/27/19 1104), Bowel and/or bladder incontinence post epidural: No (07/27/19 1104)  Reagen Goates

## 2019-07-27 NOTE — Discharge Summary (Signed)
Postpartum Discharge Summary     Patient Name: Amanda Clements DOB: 1996-06-18 MRN: [id number]  Date of admission: 07/27/2019 Delivering Provider: Donnamae Jude   Date of discharge: 07/29/2019  Admitting diagnosis: Labor and delivery, indication for care [O75.9] Hx of uterine surgery affecting current pregnancy [O34.29] Intrauterine pregnancy: [redacted]w[redacted]d    Secondary diagnosis:  Active Problems:   Bicornate uterus complicating pregnancy   Endometriosis   Rh negative, antepartum   Hx of uterine surgery affecting current pregnancy   History of splenectomy   History of pancreatectomy   Labor and delivery, indication for care  Additional problems: None     Discharge diagnosis: Term Pregnancy Delivered                                                                                                Post partum procedures: None  Augmentation: NA  Complications: None  Hospital course:  Sceduled C/S   24y.o. yo G2P1011 at 364w2das admitted to the hospital 07/27/2019 for scheduled cesarean section with the following indication:Prior Uterine Surgery.  Membrane Rupture Time/Date: 10:24 AM ,07/27/2019   Patient delivered a Viable infant.07/27/2019  Details of operation can be found in separate operative note.  Pateint had an uncomplicated postpartum course. Opted for POP's. She is ambulating, tolerating a regular diet, passing flatus, and urinating well. Patient is discharged home in stable condition on  07/29/19        Delivery time: 10:25 AM    Magnesium Sulfate received: No BMZ received: No Rhophylac: Baby A neg MMR:No Transfusion:No  Physical exam  Vitals:   07/28/19 1406 07/28/19 1922 07/28/19 2209 07/29/19 0436  BP: 117/81 123/87 116/78 109/77  Pulse: 90 89 97 80  Resp: '19 18 16 16  ' Temp: 98.8 F (37.1 C) 98.7 F (37.1 C) 99 F (37.2 C) 98.4 F (36.9 C)  TempSrc: Oral Oral Oral Oral  SpO2: 98% 100% 99% 99%  Weight:      Height:       General: alert, cooperative and no  distress Lochia: appropriate Uterine Fundus: firm Incision: Healing well with no significant drainage, No significant erythema, Dressing is clean, dry, and intact DVT Evaluation: No evidence of DVT seen on physical exam. Labs: Lab Results  Component Value Date   WBC 12.4 (H) 07/28/2019   HGB 10.1 (L) 07/28/2019   HCT 31.5 (L) 07/28/2019   MCV 90.8 07/28/2019   PLT 310 07/28/2019   CMP Latest Ref Rng & Units 07/27/2019  Creatinine 0.44 - 1.00 mg/dL 0.62    Discharge instruction: per After Visit Summary and "Baby and Me Booklet".  After visit meds:  Allergies as of 07/29/2019      Reactions   Peanut-containing Drug Products Shortness Of Breath      Medication List    TAKE these medications   acetaminophen 325 MG tablet Commonly known as: TYLENOL Take 2 tablets (650 mg total) by mouth every 6 (six) hours as needed for mild pain (temperature > 101.5.).   Blood Pressure Kit Devi 1 Device by Does not apply route as needed. ICD 10: Z34.90  cyclobenzaprine 5 MG tablet Commonly known as: FLEXERIL Take 1-2 tablets (5-10 mg total) by mouth 3 (three) times daily as needed for muscle spasms.   ferrous sulfate 325 (65 FE) MG tablet Take 1 tablet (325 mg total) by mouth every other day.   ibuprofen 800 MG tablet Commonly known as: ADVIL Take 1 tablet (800 mg total) by mouth every 8 (eight) hours.   norethindrone 0.35 MG tablet Commonly known as: MICRONOR Take 1 tablet (0.35 mg total) by mouth daily.   oxyCODONE 5 MG immediate release tablet Commonly known as: Oxy IR/ROXICODONE Take 1-2 tablets (5-10 mg total) by mouth every 4 (four) hours as needed for moderate pain.   polyethylene glycol 17 g packet Commonly known as: MIRALAX / GLYCOLAX Take 17 g by mouth daily.   PrePLUS 27-1 MG Tabs Take 1 tablet by mouth daily.   senna-docusate 8.6-50 MG tablet Commonly known as: Senokot-S Take 2 tablets by mouth daily. Start taking on: July 30, 2019       Diet: routine  diet  Activity: Advance as tolerated. Pelvic rest for 6 weeks.   Outpatient follow up:4 weeks Follow up Appt: Future Appointments  Date Time Provider [place]  08/10/2019  8:30 AM Herbster Page  09/10/2019 10:55 AM Donnamae Jude, MD [facility]   Follow up Visit:   Please schedule this patient for Postpartum visit in: 4 weeks with the following provider: Any provider For C/S patients schedule nurse incision check in weeks 2 weeks: yes High risk pregnancy complicated by: prior uterine surgery Delivery mode:  SVD Anticipated Birth Control:  POPs PP Procedures needed: Incision check  Schedule Integrated BH visit: no    Newborn Data: Live born female  Birth Weight: 3445g   APGAR: 59, 9  Newborn Delivery   Birth date/time: 07/27/2019 10:25:00 Delivery type: C-Section, Low Transverse Trial of labor: No C-section categorization: Primary      Baby Feeding: Breast Disposition:home with mother   07/29/2019 Chauncey Mann, MD

## 2019-07-27 NOTE — Interval H&P Note (Signed)
History and Physical Interval Note:  07/27/2019 9:59 AM  Amanda Clements  has presented today for surgery, with the diagnosis of history of prior uterine surgery and breech at 37 wks.  The various methods of treatment have been discussed with the patient and family. After consideration of risks, benefits and other options for treatment, the patient has consented to  Procedure(s): CESAREAN SECTION (N/A) as a surgical intervention.  The patient's history has been reviewed, patient examined, no change in status, stable for surgery.  I have reviewed the patient's chart and labs.  Questions were answered to the patient's satisfaction.     Reva Bores

## 2019-07-27 NOTE — Lactation Note (Addendum)
This note was copied from a baby's chart. Lactation Consultation Note Baby 12 hrs old. Mom called for feeding assistance d/t baby not interested in feeding and had been 4 hrs since last fed. Baby lightly grunting. Parents states he does it intermittently.  Placed in football hold, mom hand expressed colostrum easily rubbed nipple on lips. Baby had no reaction. Very sleepy. LC changed wet diaper. FOB stated he had just changed one before LC came in rm. Mom has Large breast w/great everted nipples. When baby does decide to feed, he shouldn't have any trouble latching. LC syring fed 3 ml colostrum. Baby needed stimulating for feeding. Mom states she has been leaking colostrum since 22 weeks. Discussed assessing breast and knowing when needs to pump d/t full breast. Gave mom colostrum containers to store milk in. Milk storage, newborn behavior, feeding habits, pacifiers, hand expression, pumping, supply and demand. Encouraged mom to do STS while feeding and if notices baby's grunting more and not settling, place baby STS to calm respirations. Encouraged to call for assistance or questions.   Patient Name: Amanda Clements [id number] Date: 07/27/2019 Reason for consult: Mother's request;Primapara   Maternal Data Formula Feeding for Exclusion: No Has patient been taught Hand Expression?: Yes Does the patient have breastfeeding experience prior to this delivery?: No  Feeding Feeding Type: Breast Milk  LATCH Score Latch: Too sleepy or reluctant, no latch achieved, no sucking elicited.     Type of Nipple: Everted at rest and after stimulation  Comfort (Breast/Nipple): Soft / non-tender  Hold (Positioning): Full assist, staff holds infant at breast     Interventions Interventions: Breast feeding basics reviewed;Support pillows;Assisted with latch;Position options;Expressed milk;Breast massage;Hand express;Breast compression;Adjust position  Lactation Tools Discussed/Used WIC Program:  No Pump Review: Setup, frequency, and cleaning   Consult Status Consult Status: Follow-up Date: 07/27/18 Follow-up type: In-patient    Charyl Dancer 07/27/2019, 10:35 PM

## 2019-07-27 NOTE — Anesthesia Procedure Notes (Addendum)
Spinal  Patient location during procedure: OR Start time: 07/27/2019 10:05 AM End time: 07/27/2019 10:07 AM Staffing Anesthesiologist: Bethena Midget, MD Preanesthetic Checklist Completed: patient identified, IV checked, site marked, risks and benefits discussed, surgical consent, monitors and equipment checked, pre-op evaluation and timeout performed Spinal Block Patient position: sitting Prep: DuraPrep Patient monitoring: heart rate, cardiac monitor, continuous pulse ox and blood pressure Approach: midline Location: L3-4 Injection technique: single-shot Needle Needle type: Sprotte  Needle gauge: 24 G Needle length: 9 cm Assessment Sensory level: T4

## 2019-07-27 NOTE — Transfer of Care (Signed)
Immediate Anesthesia Transfer of Care Note  Patient: Amanda Clements  Procedure(s) Performed: CESAREAN SECTION (N/A )  Patient Location: PACU  Anesthesia Type:Spinal  Level of Consciousness: awake, alert  and oriented  Airway & Oxygen Therapy: Patient Spontanous Breathing  Post-op Assessment: Report given to RN and Post -op Vital signs reviewed and stable  Post vital signs: Reviewed and stable  Last Vitals:  Vitals Value Taken Time  BP 111/66 07/27/19 1104  Temp    Pulse 78 07/27/19 1107  Resp 15 07/27/19 1107  SpO2 99 % 07/27/19 1107  Vitals shown include unvalidated device data.  Last Pain:  Vitals:   07/27/19 0755  TempSrc: Oral         Complications: No apparent anesthesia complications

## 2019-07-27 NOTE — Lactation Note (Signed)
This note was copied from a baby's chart. Lactation Consultation Note  Patient Name: Amanda Clements Date: 07/27/2019 Reason for consult: Initial assessment;Early term 37-38.6wks;Primapara;1st time breastfeeding  2029 - 2044 - I conducted an initial lactation consult with Amanda Clements and her 55 hour old son, Amanda Clements. She states that Amanda Clements has breast fed several times since delivery and that he's getting the hang of things. Her RN provided her with a manual pump, and she has been supplementing baby with hand expressed milk via spoon and syringe.  Amanda Clements declined latch assistance at this time, but will call as needed this evening. I educated on breast feeding basics including feeding frequency, output expectations, feeding cues, and feeding patterns for day 1 and 2.  I recommended that she breast feed on demand 8-12 times a day, and to wake baby up if interval between feedings exceeded 3-4 hours. I recommended that she post pump each breast 5-10 minutes and feed any EBM back to baby.  I encouraged her to call lactation this evening for assistance. Amanda Clements states that her mother in law is a Advertising copywriter, and that she will be helping her after discharge. She has a personal breast pump at home.  I made Amanda Clements aware of our OP resources and provided her with a breast feeding brochure.   Amanda Clements was holding baby in his swaddle upon entry. I encouraged STS and monitoring for feeding cues.  No further questions at this time.  Maternal Data Formula Feeding for Exclusion: No Has patient been taught Hand Expression?: Yes Does the patient have breastfeeding experience prior to this delivery?: No   Interventions Interventions: Breast feeding basics reviewed  Lactation Tools Discussed/Used Pump Review: Setup, frequency, and cleaning   Consult Status Consult Status: Follow-up Date: 07/28/19 Follow-up type: In-patient    Walker Shadow 07/27/2019, 8:50 PM

## 2019-07-28 ENCOUNTER — Encounter: Payer: Self-pay | Admitting: *Deleted

## 2019-07-28 LAB — CBC
HCT: 31.5 % — ABNORMAL LOW (ref 36.0–46.0)
Hemoglobin: 10.1 g/dL — ABNORMAL LOW (ref 12.0–15.0)
MCH: 29.1 pg (ref 26.0–34.0)
MCHC: 32.1 g/dL (ref 30.0–36.0)
MCV: 90.8 fL (ref 80.0–100.0)
Platelets: 310 10*3/uL (ref 150–400)
RBC: 3.47 MIL/uL — ABNORMAL LOW (ref 3.87–5.11)
RDW: 15.7 % — ABNORMAL HIGH (ref 11.5–15.5)
WBC: 12.4 10*3/uL — ABNORMAL HIGH (ref 4.0–10.5)
nRBC: 0.2 % (ref 0.0–0.2)

## 2019-07-28 MED ORDER — FERROUS SULFATE 325 (65 FE) MG PO TABS
325.0000 mg | ORAL_TABLET | ORAL | Status: DC
  Administered 2019-07-28: 325 mg via ORAL
  Filled 2019-07-28: qty 1

## 2019-07-28 NOTE — Progress Notes (Signed)
POSTPARTUM PROGRESS NOTE  POD #1  Subjective:  Amanda Clements is a 24 y.o. G2P1011 s/p pLTCS at [redacted]w[redacted]d.  She reports she doing well. No acute events overnight. She denies any problems with ambulating, voiding or po intake. Denies nausea or vomiting. She has passed flatus. Pain is well controlled.  Lochia is like a period.  Objective: Blood pressure 107/64, pulse 82, temperature 98 F (36.7 C), temperature source Axillary, resp. rate 18, height 5\' 7"  (1.702 m), weight 99.8 kg, last menstrual period 11/08/2018, SpO2 99 %, unknown if currently breastfeeding.  Physical Exam:  General: alert, cooperative and no distress Chest: no respiratory distress Heart:regular rate, distal pulses intact Abdomen: soft, nontender,  Uterine Fundus: firm, appropriately tender DVT Evaluation: No calf swelling or tenderness Extremities: no LE edema Skin: warm, dry; incision clean/dry/intact w/ honeycomb dressing in place  Recent Labs    07/27/19 1418 07/28/19 0442  HGB 11.2* 10.1*  HCT 33.8* 31.5*    Assessment/Plan: Amanda Clements is a 24 y.o. G2P1011 s/p pLTCS at [redacted]w[redacted]d for breech presentation and hx of uterine surgery.  POD#1 - Doing welll; pain well controlled. H/H appropriate  Routine postpartum care  OOB, ambulated  Lovenox for VTE prophylaxis Anemia: asymptomatic, start ferrous sulfate PO every other day Rh neg: infant also Rh neg, rhogam not indicated Contraception: POPs Feeding: breast  Dispo: Plan for discharge PPD#2-3.   LOS: 1 day   [redacted]w[redacted]d, MD/[facility] OB Fellow  07/28/2019, 7:20 AM

## 2019-07-29 LAB — TYPE AND SCREEN
ABO/RH(D): B NEG
Antibody Screen: POSITIVE
Unit division: 0
Unit division: 0

## 2019-07-29 LAB — BPAM RBC
Blood Product Expiration Date: 202101282359
Blood Product Expiration Date: 202101282359
Unit Type and Rh: 1700
Unit Type and Rh: 1700

## 2019-07-29 MED ORDER — FERROUS SULFATE 325 (65 FE) MG PO TABS: 325.0000 mg | ORAL_TABLET | ORAL | 0 refills | Status: AC

## 2019-07-29 MED ORDER — SENNOSIDES-DOCUSATE SODIUM 8.6-50 MG PO TABS: 2.0000 | ORAL_TABLET | ORAL | 0 refills | Status: DC

## 2019-07-29 MED ORDER — NORETHINDRONE 0.35 MG PO TABS: 1.0000 | ORAL_TABLET | Freq: Every day | ORAL | 11 refills | Status: DC

## 2019-07-29 MED ORDER — ACETAMINOPHEN 325 MG PO TABS: 650.0000 mg | ORAL_TABLET | Freq: Four times a day (QID) | ORAL | 0 refills | Status: DC | PRN

## 2019-07-29 MED ORDER — POLYETHYLENE GLYCOL 3350 17 G PO PACK: 17.0000 g | PACK | Freq: Every day | ORAL | 0 refills | Status: DC

## 2019-07-29 MED ORDER — POLYETHYLENE GLYCOL 3350 17 G PO PACK
17.0000 g | PACK | Freq: Every day | ORAL | Status: DC
  Administered 2019-07-29: 17 g via ORAL
  Filled 2019-07-29: qty 1

## 2019-07-29 MED ORDER — IBUPROFEN 800 MG PO TABS: 800.0000 mg | ORAL_TABLET | Freq: Three times a day (TID) | ORAL | 0 refills | Status: DC

## 2019-07-29 MED ORDER — OXYCODONE HCL 5 MG PO TABS: 5.0000 mg | ORAL_TABLET | ORAL | 0 refills | Status: DC | PRN

## 2019-08-06 ENCOUNTER — Encounter: Payer: BC Managed Care – PPO | Admitting: Family Medicine

## 2019-08-10 ENCOUNTER — Other Ambulatory Visit: Payer: Self-pay

## 2019-08-10 ENCOUNTER — Ambulatory Visit (INDEPENDENT_AMBULATORY_CARE_PROVIDER_SITE_OTHER): Payer: BC Managed Care – PPO

## 2019-08-10 VITALS — BP 123/78 | HR 63 | Wt 207.6 lb

## 2019-08-10 DIAGNOSIS — Z5189 Encounter for other specified aftercare: Secondary | ICD-10-CM

## 2019-08-10 NOTE — Progress Notes (Signed)
Pt here today for incision check s/p primary c-section on 07/27/19.  Pt denies any pain however scant vaginal bleeding.  Incision well approximated, no odor, no drainage, no edema, and erythema.  Pt advised to continue to monitor for sx's of infection.  Verified with pt that she knew of her pp appt scheduled for 09/10/19.  Pt verbalized understanding with no further questions.  Addison Naegeli, RN 08/10/19

## 2019-08-17 NOTE — Progress Notes (Signed)
Patient ID: Amanda Clements, female   DOB: [birth year], 24 y.o.   MRN: [id number] Patient seen and assessed by nursing staff during this encounter. I have reviewed the chart and agree with the documentation and plan.  Scheryl Darter, MD 08/17/2019 11:41 AM

## 2019-09-10 ENCOUNTER — Ambulatory Visit: Payer: BC Managed Care – PPO | Admitting: Family Medicine

## 2019-09-11 ENCOUNTER — Ambulatory Visit (INDEPENDENT_AMBULATORY_CARE_PROVIDER_SITE_OTHER): Payer: BC Managed Care – PPO | Admitting: Family Medicine

## 2019-09-11 ENCOUNTER — Encounter: Payer: Self-pay | Admitting: Family Medicine

## 2019-09-11 ENCOUNTER — Other Ambulatory Visit: Payer: Self-pay

## 2019-09-11 DIAGNOSIS — Z30011 Encounter for initial prescription of contraceptive pills: Secondary | ICD-10-CM

## 2019-09-11 DIAGNOSIS — Z1332 Encounter for screening for maternal depression: Secondary | ICD-10-CM

## 2019-09-11 MED ORDER — NORGESTIMATE-ETH ESTRADIOL 0.25-35 MG-MCG PO TABS: 1.0000 | ORAL_TABLET | Freq: Every day | ORAL | 5 refills | Status: DC

## 2019-09-11 NOTE — Progress Notes (Signed)
Subjective:     Amanda Clements is a 24 y.o. female who presents for a postpartum visit. She is 7 weeks postpartum following a low cervical transverse Cesarean section. I have fully reviewed the prenatal and intrapartum course. The delivery was at 37 gestational weeks. Outcome: primary cesarean section, low transverse incision. Anesthesia: spinal. Postpartum course has been normal. Baby's course has been poor weight gain, improved on formula. Baby is feeding by bottle - Similac Sensitive RS. Bleeding no bleeding. Bowel function is normal. Bladder function is normal. Patient is not sexually active. Contraception method is OCP (estrogen/progesterone). Postpartum depression screening: negative.  The following portions of the patient's history were reviewed and updated as appropriate: allergies, current medications, past family history, past medical history, past social history, past surgical history and problem list.  Review of Systems Pertinent items noted in HPI and remainder of comprehensive ROS otherwise negative.   Objective:    BP 112/77   Pulse 67   Wt 205 lb (93 kg)   LMP 11/08/2018 (Exact Date)   BMI 32.11 kg/m   General:  alert, cooperative and appears stated age  Lungs: normal effort  Heart:  regular rate and rhythm  Abdomen: soft, non-tender; bowel sounds normal; no masses,  no organomegaly and incision is well healed        Assessment:    Normal postpartum exam. Pap smear not done at today's visit.   Plan:   1. Contraception: OCP (estrogen/progesterone) 2. Skip placebos due to h/o endometriosis 3. Pap due 2022. 4. Follow up in: 3 months or as needed.

## 2019-09-28 ENCOUNTER — Institutional Professional Consult (permissible substitution): Payer: BC Managed Care – PPO

## 2019-09-28 ENCOUNTER — Ambulatory Visit: Payer: BC Managed Care – PPO | Admitting: Licensed Clinical Social Worker

## 2019-09-28 DIAGNOSIS — Z5329 Procedure and treatment not carried out because of patient's decision for other reasons: Secondary | ICD-10-CM

## 2019-09-28 DIAGNOSIS — Z91199 Patient's noncompliance with other medical treatment and regimen due to unspecified reason: Secondary | ICD-10-CM

## 2019-09-28 NOTE — BH Specialist Note (Signed)
Left message regarding visit.Marland Kitchenrequested call back to reschedule

## 2019-11-13 DIAGNOSIS — M9903 Segmental and somatic dysfunction of lumbar region: Secondary | ICD-10-CM | POA: Diagnosis not present

## 2019-11-13 DIAGNOSIS — M545 Low back pain: Secondary | ICD-10-CM | POA: Diagnosis not present

## 2019-11-13 DIAGNOSIS — M25552 Pain in left hip: Secondary | ICD-10-CM | POA: Diagnosis not present

## 2019-11-13 DIAGNOSIS — M25551 Pain in right hip: Secondary | ICD-10-CM | POA: Diagnosis not present

## 2019-11-15 DIAGNOSIS — M9905 Segmental and somatic dysfunction of pelvic region: Secondary | ICD-10-CM | POA: Diagnosis not present

## 2019-11-15 DIAGNOSIS — M25551 Pain in right hip: Secondary | ICD-10-CM | POA: Diagnosis not present

## 2019-11-15 DIAGNOSIS — M545 Low back pain: Secondary | ICD-10-CM | POA: Diagnosis not present

## 2019-11-15 DIAGNOSIS — M9903 Segmental and somatic dysfunction of lumbar region: Secondary | ICD-10-CM | POA: Diagnosis not present

## 2019-11-17 DIAGNOSIS — M9905 Segmental and somatic dysfunction of pelvic region: Secondary | ICD-10-CM | POA: Diagnosis not present

## 2019-11-17 DIAGNOSIS — M545 Low back pain: Secondary | ICD-10-CM | POA: Diagnosis not present

## 2019-11-17 DIAGNOSIS — M25551 Pain in right hip: Secondary | ICD-10-CM | POA: Diagnosis not present

## 2019-11-17 DIAGNOSIS — M9903 Segmental and somatic dysfunction of lumbar region: Secondary | ICD-10-CM | POA: Diagnosis not present

## 2019-11-19 DIAGNOSIS — M9903 Segmental and somatic dysfunction of lumbar region: Secondary | ICD-10-CM | POA: Diagnosis not present

## 2019-11-19 DIAGNOSIS — M25551 Pain in right hip: Secondary | ICD-10-CM | POA: Diagnosis not present

## 2019-11-19 DIAGNOSIS — M545 Low back pain: Secondary | ICD-10-CM | POA: Diagnosis not present

## 2019-11-19 DIAGNOSIS — M9905 Segmental and somatic dysfunction of pelvic region: Secondary | ICD-10-CM | POA: Diagnosis not present

## 2019-11-29 DIAGNOSIS — M25551 Pain in right hip: Secondary | ICD-10-CM | POA: Diagnosis not present

## 2019-11-29 DIAGNOSIS — M9905 Segmental and somatic dysfunction of pelvic region: Secondary | ICD-10-CM | POA: Diagnosis not present

## 2019-11-29 DIAGNOSIS — M9903 Segmental and somatic dysfunction of lumbar region: Secondary | ICD-10-CM | POA: Diagnosis not present

## 2019-11-29 DIAGNOSIS — M545 Low back pain: Secondary | ICD-10-CM | POA: Diagnosis not present

## 2019-12-03 DIAGNOSIS — M9905 Segmental and somatic dysfunction of pelvic region: Secondary | ICD-10-CM | POA: Diagnosis not present

## 2019-12-03 DIAGNOSIS — M9903 Segmental and somatic dysfunction of lumbar region: Secondary | ICD-10-CM | POA: Diagnosis not present

## 2019-12-03 DIAGNOSIS — M545 Low back pain: Secondary | ICD-10-CM | POA: Diagnosis not present

## 2019-12-03 DIAGNOSIS — M25551 Pain in right hip: Secondary | ICD-10-CM | POA: Diagnosis not present

## 2019-12-06 DIAGNOSIS — M25551 Pain in right hip: Secondary | ICD-10-CM | POA: Diagnosis not present

## 2019-12-06 DIAGNOSIS — M9905 Segmental and somatic dysfunction of pelvic region: Secondary | ICD-10-CM | POA: Diagnosis not present

## 2019-12-06 DIAGNOSIS — M9903 Segmental and somatic dysfunction of lumbar region: Secondary | ICD-10-CM | POA: Diagnosis not present

## 2019-12-06 DIAGNOSIS — M545 Low back pain: Secondary | ICD-10-CM | POA: Diagnosis not present

## 2019-12-18 DIAGNOSIS — M9903 Segmental and somatic dysfunction of lumbar region: Secondary | ICD-10-CM | POA: Diagnosis not present

## 2019-12-18 DIAGNOSIS — M25551 Pain in right hip: Secondary | ICD-10-CM | POA: Diagnosis not present

## 2019-12-18 DIAGNOSIS — M545 Low back pain: Secondary | ICD-10-CM | POA: Diagnosis not present

## 2019-12-18 DIAGNOSIS — M9905 Segmental and somatic dysfunction of pelvic region: Secondary | ICD-10-CM | POA: Diagnosis not present

## 2019-12-20 DIAGNOSIS — M25551 Pain in right hip: Secondary | ICD-10-CM | POA: Diagnosis not present

## 2019-12-20 DIAGNOSIS — M545 Low back pain: Secondary | ICD-10-CM | POA: Diagnosis not present

## 2019-12-20 DIAGNOSIS — M9905 Segmental and somatic dysfunction of pelvic region: Secondary | ICD-10-CM | POA: Diagnosis not present

## 2019-12-20 DIAGNOSIS — M9903 Segmental and somatic dysfunction of lumbar region: Secondary | ICD-10-CM | POA: Diagnosis not present

## 2019-12-24 ENCOUNTER — Telehealth (INDEPENDENT_AMBULATORY_CARE_PROVIDER_SITE_OTHER): Payer: Self-pay | Admitting: Nurse Practitioner

## 2019-12-24 ENCOUNTER — Encounter: Payer: Self-pay | Admitting: Nurse Practitioner

## 2019-12-24 DIAGNOSIS — Z3009 Encounter for other general counseling and advice on contraception: Secondary | ICD-10-CM

## 2019-12-24 DIAGNOSIS — M9905 Segmental and somatic dysfunction of pelvic region: Secondary | ICD-10-CM | POA: Diagnosis not present

## 2019-12-24 DIAGNOSIS — Z98891 History of uterine scar from previous surgery: Secondary | ICD-10-CM

## 2019-12-24 DIAGNOSIS — Z90711 Acquired absence of uterus with remaining cervical stump: Secondary | ICD-10-CM

## 2019-12-24 DIAGNOSIS — M545 Low back pain: Secondary | ICD-10-CM | POA: Diagnosis not present

## 2019-12-24 DIAGNOSIS — Z87718 Personal history of other specified (corrected) congenital malformations of genitourinary system: Secondary | ICD-10-CM

## 2019-12-24 DIAGNOSIS — M25551 Pain in right hip: Secondary | ICD-10-CM | POA: Diagnosis not present

## 2019-12-24 DIAGNOSIS — M9903 Segmental and somatic dysfunction of lumbar region: Secondary | ICD-10-CM | POA: Diagnosis not present

## 2019-12-24 NOTE — Progress Notes (Signed)
Pt states that she stopped taking the [facility] Pills after a month and half of them being Rx due to feeling  Really depressed.

## 2019-12-24 NOTE — Patient Instructions (Signed)
Levonorgestrel intrauterine device (IUD) What is this medicine? LEVONORGESTREL IUD (LEE voe nor jes trel) is a contraceptive (birth control) device. The device is placed inside the uterus by a healthcare professional. It is used to prevent pregnancy. This device can also be used to treat heavy bleeding that occurs during your period. This medicine may be used for other purposes; ask your health care provider or pharmacist if you have questions. COMMON BRAND NAME(S): Kyleena, LILETTA, Mirena, Skyla What should I tell my health care provider before I take this medicine? They need to know if you have any of these conditions:  abnormal Pap smear  cancer of the breast, uterus, or cervix  diabetes  endometritis  genital or pelvic infection now or in the past  have more than one sexual partner or your partner has more than one partner  heart disease  history of an ectopic or tubal pregnancy  immune system problems  IUD in place  liver disease or tumor  problems with blood clots or take blood-thinners  seizures  use intravenous drugs  uterus of unusual shape  vaginal bleeding that has not been explained  an unusual or allergic reaction to levonorgestrel, other hormones, silicone, or polyethylene, medicines, foods, dyes, or preservatives  pregnant or trying to get pregnant  breast-feeding How should I use this medicine? This device is placed inside the uterus by a health care professional. Talk to your pediatrician regarding the use of this medicine in children. Special care may be needed. Overdosage: If you think you have taken too much of this medicine contact a poison control center or emergency room at once. NOTE: This medicine is only for you. Do not share this medicine with others. What if I miss a dose? This does not apply. Depending on the brand of device you have inserted, the device will need to be replaced every 3 to 6 years if you wish to continue using this type  of birth control. What may interact with this medicine? Do not take this medicine with any of the following medications:  amprenavir  bosentan  fosamprenavir This medicine may also interact with the following medications:  aprepitant  armodafinil  barbiturate medicines for inducing sleep or treating seizures  bexarotene  boceprevir  griseofulvin  medicines to treat seizures like carbamazepine, ethotoin, felbamate, oxcarbazepine, phenytoin, topiramate  modafinil  pioglitazone  rifabutin  rifampin  rifapentine  some medicines to treat HIV infection like atazanavir, efavirenz, indinavir, lopinavir, nelfinavir, tipranavir, ritonavir  St. John's wort  warfarin This list may not describe all possible interactions. Give your health care provider a list of all the medicines, herbs, non-prescription drugs, or dietary supplements you use. Also tell them if you smoke, drink alcohol, or use illegal drugs. Some items may interact with your medicine. What should I watch for while using this medicine? Visit your doctor or health care professional for regular check ups. See your doctor if you or your partner has sexual contact with others, becomes HIV positive, or gets a sexual transmitted disease. This product does not protect you against HIV infection (AIDS) or other sexually transmitted diseases. You can check the placement of the IUD yourself by reaching up to the top of your vagina with clean fingers to feel the threads. Do not pull on the threads. It is a good habit to check placement after each menstrual period. Call your doctor right away if you feel more of the IUD than just the threads or if you cannot feel the threads at   all. The IUD may come out by itself. You may become pregnant if the device comes out. If you notice that the IUD has come out use a backup birth control method like condoms and call your health care provider. Using tampons will not change the position of the  IUD and are okay to use during your period. This IUD can be safely scanned with magnetic resonance imaging (MRI) only under specific conditions. Before you have an MRI, tell your healthcare provider that you have an IUD in place, and which type of IUD you have in place. What side effects may I notice from receiving this medicine? Side effects that you should report to your doctor or health care professional as soon as possible:  allergic reactions like skin rash, itching or hives, swelling of the face, lips, or tongue  fever, flu-like symptoms  genital sores  high blood pressure  no menstrual period for 6 weeks during use  pain, swelling, warmth in the leg  pelvic pain or tenderness  severe or sudden headache  signs of pregnancy  stomach cramping  sudden shortness of breath  trouble with balance, talking, or walking  unusual vaginal bleeding, discharge  yellowing of the eyes or skin Side effects that usually do not require medical attention (report to your doctor or health care professional if they continue or are bothersome):  acne  breast pain  change in sex drive or performance  changes in weight  cramping, dizziness, or faintness while the device is being inserted  headache  irregular menstrual bleeding within first 3 to 6 months of use  nausea This list may not describe all possible side effects. Call your doctor for medical advice about side effects. You may report side effects to FDA at 1-800-FDA-1088. Where should I keep my medicine? This does not apply. NOTE: This sheet is a summary. It may not cover all possible information. If you have questions about this medicine, talk to your doctor, pharmacist, or health care provider.  2020 Elsevier/Gold Standard (2018-05-16 13:22:01)  
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
## 2019-12-26 DIAGNOSIS — M545 Low back pain: Secondary | ICD-10-CM | POA: Diagnosis not present

## 2019-12-26 DIAGNOSIS — M9905 Segmental and somatic dysfunction of pelvic region: Secondary | ICD-10-CM | POA: Diagnosis not present

## 2019-12-26 DIAGNOSIS — M9903 Segmental and somatic dysfunction of lumbar region: Secondary | ICD-10-CM | POA: Diagnosis not present

## 2019-12-26 DIAGNOSIS — M25551 Pain in right hip: Secondary | ICD-10-CM | POA: Diagnosis not present

## 2019-12-31 DIAGNOSIS — M9903 Segmental and somatic dysfunction of lumbar region: Secondary | ICD-10-CM | POA: Diagnosis not present

## 2019-12-31 DIAGNOSIS — M25551 Pain in right hip: Secondary | ICD-10-CM | POA: Diagnosis not present

## 2019-12-31 DIAGNOSIS — M545 Low back pain: Secondary | ICD-10-CM | POA: Diagnosis not present

## 2019-12-31 DIAGNOSIS — M9905 Segmental and somatic dysfunction of pelvic region: Secondary | ICD-10-CM | POA: Diagnosis not present

## 2020-01-02 ENCOUNTER — Other Ambulatory Visit: Payer: Self-pay

## 2020-01-02 ENCOUNTER — Ambulatory Visit (INDEPENDENT_AMBULATORY_CARE_PROVIDER_SITE_OTHER): Payer: Self-pay | Admitting: Obstetrics and Gynecology

## 2020-01-02 ENCOUNTER — Encounter: Payer: Self-pay | Admitting: Obstetrics and Gynecology

## 2020-01-02 ENCOUNTER — Telehealth: Payer: Self-pay | Admitting: Lactation Services

## 2020-01-02 VITALS — BP 130/78 | HR 79 | Ht 67.0 in | Wt 211.4 lb

## 2020-01-02 DIAGNOSIS — N920 Excessive and frequent menstruation with regular cycle: Secondary | ICD-10-CM

## 2020-01-02 DIAGNOSIS — R0602 Shortness of breath: Secondary | ICD-10-CM

## 2020-01-02 MED ORDER — NORETHINDRONE ACETATE 5 MG PO TABS: 10.0000 mg | ORAL_TABLET | Freq: Three times a day (TID) | ORAL | 0 refills | Status: DC
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
## 2020-01-02 NOTE — Telephone Encounter (Signed)
Called patient to discuss her concerns.   She reports she is wearing a super plus tampons and is having to wear a pad in addition and is bleeding through. She is changing every 30-60 minutes.   She has been bleeding since June 2 and it has gotten worse over time.   Discussed with Dr. Vergie Living and Aygestin ordered. Patient verified that she has insurance and will bring her card in at her next visit.   Reviewed with patient to call with any questions/concerns or worsening of bleeding.   She reports she is feeling tired with dizziness when working out. Reviewed not working out if dizzy and to drink plenty of fluids. She is aware to go to Urgent Care of ED if she is worsening.

## 2020-01-02 NOTE — Telephone Encounter (Signed)
1330  Pt left additional message @ 1147 stating that she has been feeling lightheaded for a couple hours and her stomach is now tender to the touch. I called pt back and offered appointment in the office today @ 3pm. She voiced understanding and accepted.

## 2020-01-02 NOTE — Progress Notes (Signed)
Pt declines [facility] for elevated depression screening.

## 2020-01-03 LAB — COMPREHENSIVE METABOLIC PANEL
ALT: 19 IU/L (ref 0–32)
AST: 16 IU/L (ref 0–40)
Albumin/Globulin Ratio: 1.2 (ref 1.2–2.2)
Albumin: 4 g/dL (ref 3.9–5.0)
Alkaline Phosphatase: 117 IU/L (ref 48–121)
BUN/Creatinine Ratio: 11 (ref 9–23)
BUN: 11 mg/dL (ref 6–20)
Bilirubin Total: 0.2 mg/dL (ref 0.0–1.2)
CO2: 24 mmol/L (ref 20–29)
Calcium: 10.2 mg/dL (ref 8.7–10.2)
Chloride: 100 mmol/L (ref 96–106)
Creatinine, Ser: 1.03 mg/dL — ABNORMAL HIGH (ref 0.57–1.00)
GFR calc Af Amer: 89 mL/min/{1.73_m2} (ref >59)
GFR calc non Af Amer: 77 mL/min/{1.73_m2} (ref >59)
Globulin, Total: 3.4 g/dL (ref 1.5–4.5)
Glucose: 92 mg/dL (ref 65–99)
Potassium: 5.6 mmol/L — ABNORMAL HIGH (ref 3.5–5.2)
Sodium: 138 mmol/L (ref 134–144)
Total Protein: 7.4 g/dL (ref 6.0–8.5)

## 2020-01-03 LAB — CBC
Hematocrit: 44.2 % (ref 34.0–46.6)
Hemoglobin: 14.1 g/dL (ref 11.1–15.9)
MCH: 27.1 pg (ref 26.6–33.0)
MCHC: 31.9 g/dL (ref 31.5–35.7)
MCV: 85 fL (ref 79–97)
Platelets: 571 10*3/uL — ABNORMAL HIGH (ref 150–450)
RBC: 5.2 x10E6/uL (ref 3.77–5.28)
RDW: 15 % (ref 11.7–15.4)
WBC: 11.7 10*3/uL — ABNORMAL HIGH (ref 3.4–10.8)

## 2020-01-03 NOTE — Addendum Note (Signed)
Addended by: Leroy Libman on: 01/03/2020 12:17 PM   Modules accepted: Orders

## 2020-01-07 ENCOUNTER — Ambulatory Visit: Payer: Self-pay | Admitting: Women's Health

## 2020-01-10 ENCOUNTER — Telehealth: Payer: Self-pay

## 2020-01-10 NOTE — Telephone Encounter (Signed)
-----   Message from Conan Bowens, MD sent at 01/03/2020 12:17 PM EDT ----- Normal H/H, inc Cr, thrombocytosis, hyperkalemia I called and left VM asking patient to call office, also sent mychart message Recommend CT chest with concern for clot, however if feeling significantly worse with SOB or can't get in for Ct before weekend then recommend evaluation in ED

## 2020-01-10 NOTE — Telephone Encounter (Signed)
Called patient for results from Dr.Davis patient stated that she felt fine and that she did read the message that was sent to her. Advised about CT scan and that if she felt SOB or chest pains to got to the ER.

## 2020-01-14 ENCOUNTER — Telehealth (INDEPENDENT_AMBULATORY_CARE_PROVIDER_SITE_OTHER): Payer: Self-pay | Admitting: Lactation Services

## 2020-01-14 DIAGNOSIS — N939 Abnormal uterine and vaginal bleeding, unspecified: Secondary | ICD-10-CM

## 2020-01-14 NOTE — Telephone Encounter (Signed)
Called patient in regards for her request to call her about abnormal bleeding.   She took Aygestin until Saturday 6/19 and stopped taking and bleeding stopped. She started bleeding again last night 6/27 and had to change pad 3-4 times last night, the bleeding is heavy and is saturating pads. She is cramping badly this time, she is taking Ibuprofen for cramping and that is helping very little. Reviewed alternating with Tylenol. Patient denies SOB or dizziness.    She is aware to seek care in ED if needed for worsening pain or bleeding.   Will route chart to Dr. Earlene Plater for recommendation for POC. Patient does have more Aygestin at home, advised her to start taking again.

## 2020-01-15 ENCOUNTER — Telehealth: Payer: Self-pay

## 2020-01-15 NOTE — Telephone Encounter (Signed)
Pt called stating that she is f/u with a call from yesterday to see someone has spoken to Dr. Earlene Plater.   Called pt and informed pt that Dr. Earlene Plater has been out of the office however the recommendation from Jasmine December was to continue to the Aygestin as prescribed and per Earlene Plater note.  Pt informed me that she has an appt scheduled on 01/22/20 for IUD insertion. I explained to the pt that she wants to keep that appt because the IUD is a long term management for her bleeding however I recommend that she is patient with the device for 4-6 months after the insertion before she sees how her bleeding will be.  Pt also requested to have her CBC, and hormone levels checked.  I advised pt that she can have her labs drawn at the appt on 01/22/20.  Pt verbalized understanding.   Addison Naegeli, RN

## 2020-01-16 NOTE — Telephone Encounter (Signed)
Needs to make f/u appointment to discuss next steps for bleeding, cont Aygestin.

## 2020-01-16 NOTE — Telephone Encounter (Signed)
Called pt to discuss response from Dr. Earlene Plater and she did not answer. Per chart review, pt has appt scheduled on 7/6 @ 1055 for IUD insertion. MyChart message was sent to pt instructing her to continue taking Aygestin and keep appt on 7/6 as scheduled.

## 2020-01-22 ENCOUNTER — Ambulatory Visit (INDEPENDENT_AMBULATORY_CARE_PROVIDER_SITE_OTHER): Payer: BLUE CROSS/BLUE SHIELD | Admitting: Advanced Practice Midwife

## 2020-01-22 ENCOUNTER — Encounter: Payer: Self-pay | Admitting: Advanced Practice Midwife

## 2020-01-22 ENCOUNTER — Other Ambulatory Visit: Payer: Self-pay

## 2020-01-22 VITALS — BP 119/77 | HR 78 | Wt 211.9 lb

## 2020-01-22 DIAGNOSIS — N939 Abnormal uterine and vaginal bleeding, unspecified: Secondary | ICD-10-CM | POA: Diagnosis not present

## 2020-01-22 DIAGNOSIS — Z3202 Encounter for pregnancy test, result negative: Secondary | ICD-10-CM

## 2020-01-22 DIAGNOSIS — Z30430 Encounter for insertion of intrauterine contraceptive device: Secondary | ICD-10-CM

## 2020-01-22 LAB — CBC
Hematocrit: 43.1 % (ref 34.0–46.6)
Hemoglobin: 13.7 g/dL (ref 11.1–15.9)
MCH: 27.3 pg (ref 26.6–33.0)
MCHC: 31.8 g/dL (ref 31.5–35.7)
MCV: 86 fL (ref 79–97)
Platelets: 537 10*3/uL — ABNORMAL HIGH (ref 150–450)
RBC: 5.01 x10E6/uL (ref 3.77–5.28)
RDW: 15.8 % — ABNORMAL HIGH (ref 11.7–15.4)
WBC: 9.8 10*3/uL (ref 3.4–10.8)

## 2020-01-22 LAB — POCT PREGNANCY, URINE: Preg Test, Ur: NEGATIVE

## 2020-01-22 MED ORDER — LEVONORGESTREL 19.5 MCG/DAY IU IUD
INTRAUTERINE_SYSTEM | Freq: Once | INTRAUTERINE | Status: AC
  Administered 2020-01-22: 1 via INTRAUTERINE

## 2020-01-22 NOTE — Addendum Note (Signed)
Addended by: Maxwell Marion E on: 01/22/2020 03:17 PM   Modules accepted: Orders

## 2020-01-22 NOTE — Patient Instructions (Signed)

## 2020-01-22 NOTE — Progress Notes (Signed)
GYNECOLOGY OFFICE PROCEDURE NOTE  Amanda Clements is a 24 y.o. G2P1011 here for Liletta IUD insertion. Patient has had heavy bleeding. She has been taking Agestin for this, but has not had benefit from it. She is here today for IUD insertion to see if this controls bleeding. She states that the bleeding is irregular. She reports that in the last 6 weeks she has been bleeding for 4 weeks.   Last pap smear was on 04/2019 and was normal per patient.   IUD Insertion Procedure Note Patient identified, informed consent performed, consent signed.   Discussed risks of irregular bleeding, cramping, infection, malpositioning or misplacement of the IUD outside the uterus which may require further procedure such as laparoscopy. Time out was performed.  Urine pregnancy test negative.  Speculum placed in the vagina.  Side wall retractors used to help visualize cervix.  Cleaned with Betadine x 2.  Grasped anteriorly with a single tooth tenaculum.  Uterus sounded to 8 cm.  Liletta IUD placed per manufacturer's recommendations.  Strings trimmed to 3 cm. Tenaculum was removed, good hemostasis noted.  Patient tolerated procedure well.   Patient was given post-procedure instructions.  She was advised to have backup contraception for one week.  Patient was also asked to check IUD strings periodically and follow up in 4 weeks for IUD check.   Thressa Sheller DNP, CNM  01/22/20  12:16 PM

## 2020-02-26 ENCOUNTER — Ambulatory Visit: Payer: BLUE CROSS/BLUE SHIELD | Admitting: Obstetrics and Gynecology

## 2020-03-10 LAB — RPR: RPR Ser Ql: NONREACTIVE — AB

## 2020-03-25 DIAGNOSIS — M545 Low back pain: Secondary | ICD-10-CM | POA: Diagnosis not present

## 2020-03-25 DIAGNOSIS — M9903 Segmental and somatic dysfunction of lumbar region: Secondary | ICD-10-CM | POA: Diagnosis not present

## 2020-03-25 DIAGNOSIS — M9905 Segmental and somatic dysfunction of pelvic region: Secondary | ICD-10-CM | POA: Diagnosis not present

## 2020-03-25 DIAGNOSIS — M25551 Pain in right hip: Secondary | ICD-10-CM | POA: Diagnosis not present

## 2020-03-28 DIAGNOSIS — M9905 Segmental and somatic dysfunction of pelvic region: Secondary | ICD-10-CM | POA: Diagnosis not present

## 2020-03-28 DIAGNOSIS — M545 Low back pain: Secondary | ICD-10-CM | POA: Diagnosis not present

## 2020-03-28 DIAGNOSIS — M9903 Segmental and somatic dysfunction of lumbar region: Secondary | ICD-10-CM | POA: Diagnosis not present

## 2020-03-28 DIAGNOSIS — M25551 Pain in right hip: Secondary | ICD-10-CM | POA: Diagnosis not present

## 2020-04-16 DIAGNOSIS — B379 Candidiasis, unspecified: Secondary | ICD-10-CM

## 2020-04-16 MED ORDER — FLUCONAZOLE 150 MG PO TABS: 150.0000 mg | ORAL_TABLET | Freq: Once | ORAL | 0 refills | Status: AC

## 2020-04-18 ENCOUNTER — Other Ambulatory Visit (HOSPITAL_COMMUNITY)
Admission: RE | Admit: 2020-04-18 | Discharge: 2020-04-18 | Disposition: A | Discharge status: Home / Self Care | Payer: BLUE CROSS/BLUE SHIELD | Source: Ambulatory Visit | Attending: Family Medicine | Admitting: Family Medicine

## 2020-04-18 ENCOUNTER — Other Ambulatory Visit: Payer: Self-pay

## 2020-04-18 ENCOUNTER — Ambulatory Visit (INDEPENDENT_AMBULATORY_CARE_PROVIDER_SITE_OTHER): Payer: BLUE CROSS/BLUE SHIELD | Admitting: *Deleted

## 2020-04-18 ENCOUNTER — Encounter: Payer: Self-pay | Admitting: *Deleted

## 2020-04-18 VITALS — BP 117/82 | HR 81 | Ht 68.0 in | Wt 208.3 lb

## 2020-04-18 DIAGNOSIS — B373 Candidiasis of vulva and vagina: Secondary | ICD-10-CM | POA: Insufficient documentation

## 2020-04-18 DIAGNOSIS — B9689 Other specified bacterial agents as the cause of diseases classified elsewhere: Secondary | ICD-10-CM | POA: Diagnosis not present

## 2020-04-18 DIAGNOSIS — N898 Other specified noninflammatory disorders of vagina: Secondary | ICD-10-CM | POA: Diagnosis not present

## 2020-04-18 DIAGNOSIS — N760 Acute vaginitis: Secondary | ICD-10-CM | POA: Diagnosis not present

## 2020-04-18 NOTE — Progress Notes (Signed)
Pt reports white vaginal discharge with irritation and itching x3 days. She started using OTC Monistat last night. Pt was informed to continue using Monistat for a total of 7 nights and that she may use OTC hydrocortisone to external genitalia as well. Self swab was obtained for wet prep. Pt advised she will be notified of results and treatment if indicated via MyChart. She voiced understanding of information and isntructions given.

## 2020-04-21 ENCOUNTER — Other Ambulatory Visit: Payer: Self-pay | Admitting: Family Medicine

## 2020-04-21 LAB — CERVICOVAGINAL ANCILLARY ONLY
Bacterial Vaginitis (gardnerella): POSITIVE — AB
Candida Glabrata: NEGATIVE
Candida Vaginitis: POSITIVE — AB
Chlamydia: NEGATIVE
Comment: NEGATIVE
Comment: NEGATIVE
Comment: NEGATIVE
Comment: NEGATIVE
Comment: NEGATIVE
Comment: NORMAL
Neisseria Gonorrhea: NEGATIVE
Trichomonas: NEGATIVE

## 2020-04-21 IMAGING — US US MFM OB FOLLOW-UP
1 series · 14 of 28 positions shown · non-contrast
Comparison: none

[Series 1: us mfm ob follow-up · 30 acquisitions, 14 frames shown]
[im 2/30]
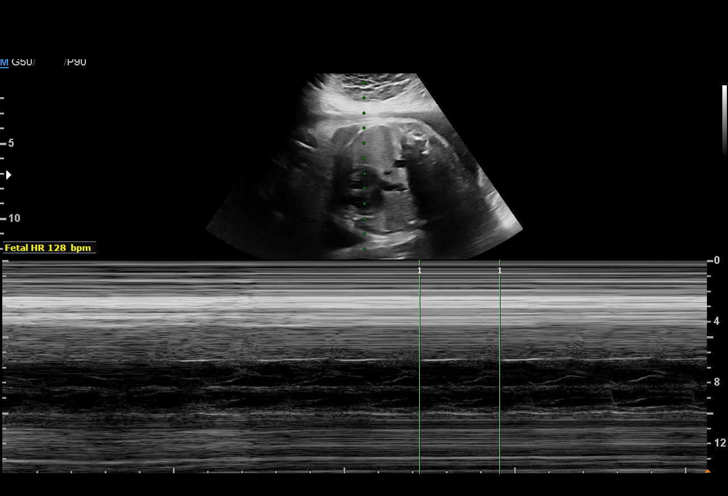
[im 4/30]
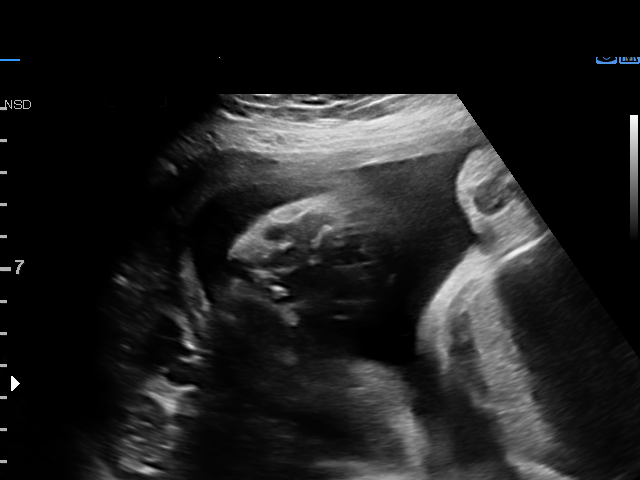
[im 6/30]
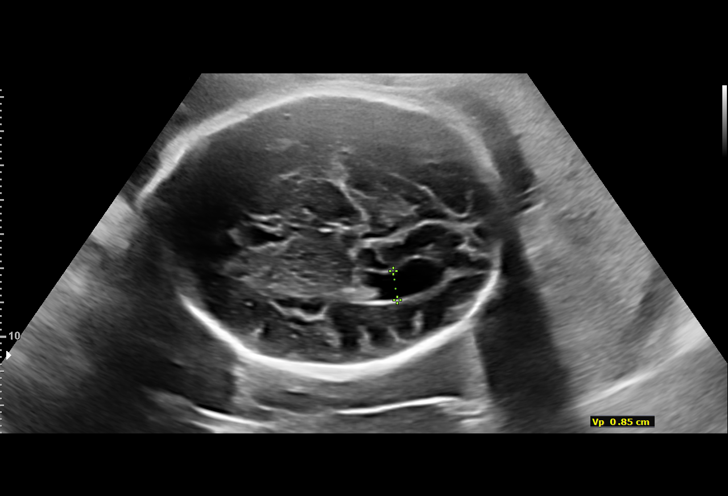
[im 8/30]
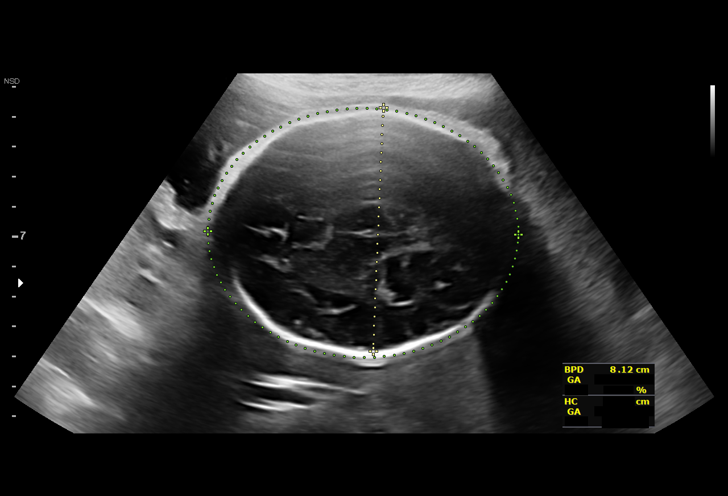
[im 10/30]
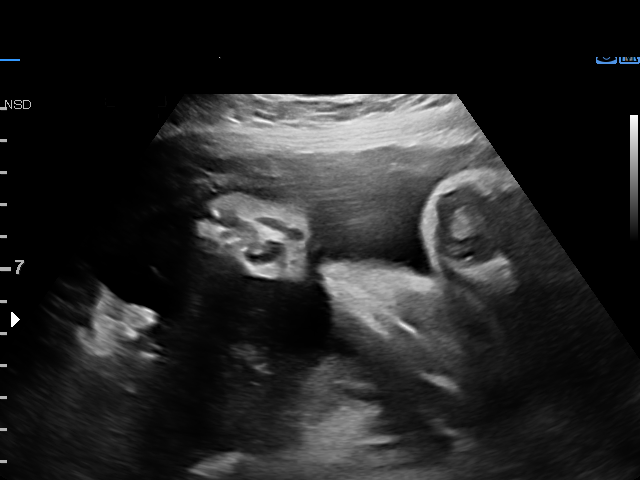
[im 12/30]
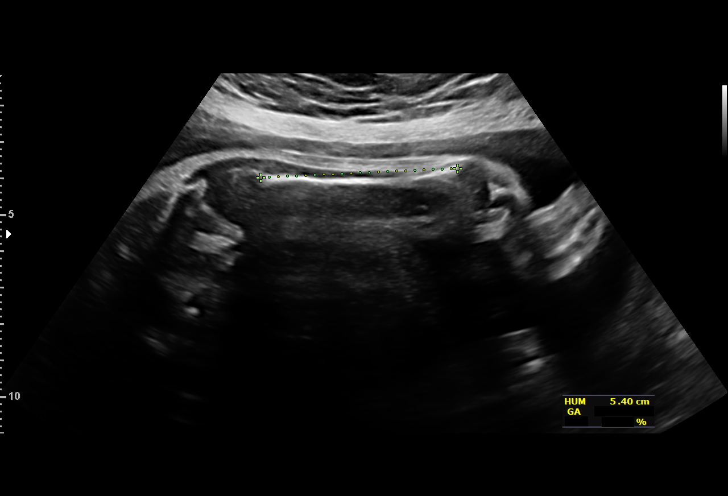
[im 14/30]
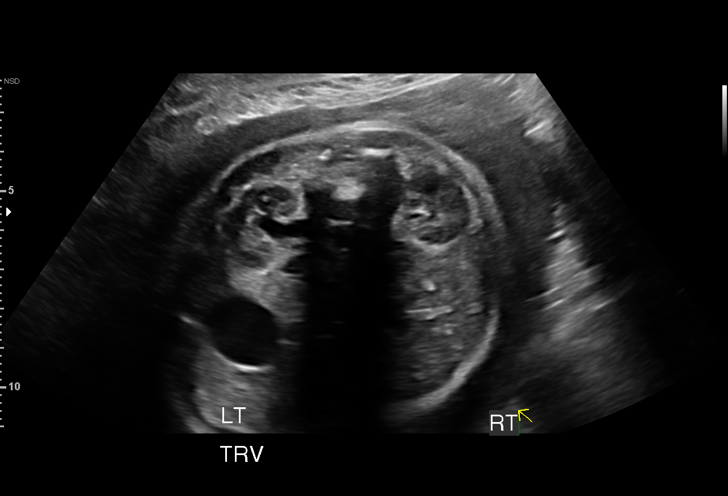
[im 17/30]
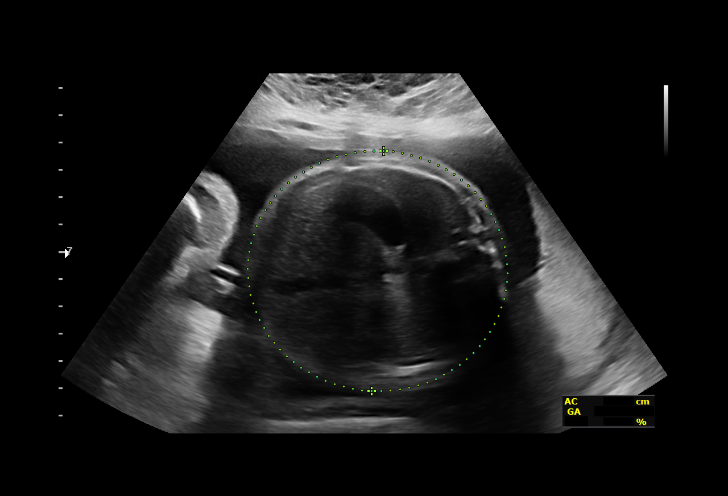
[im 19/30]
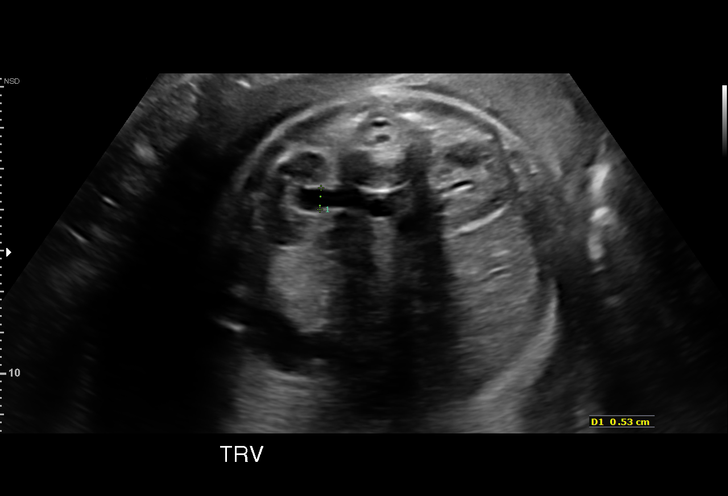
[im 21/30]
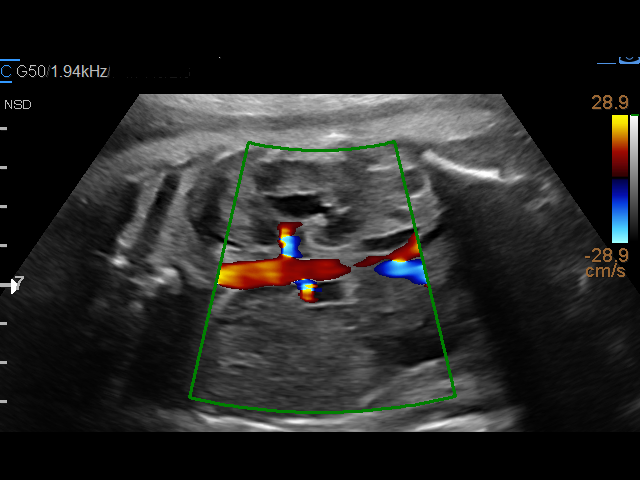
[im 23/30]
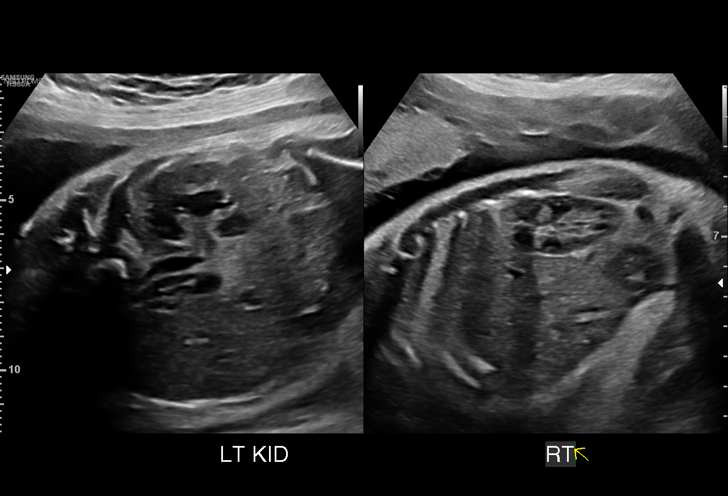
[im 25/30]
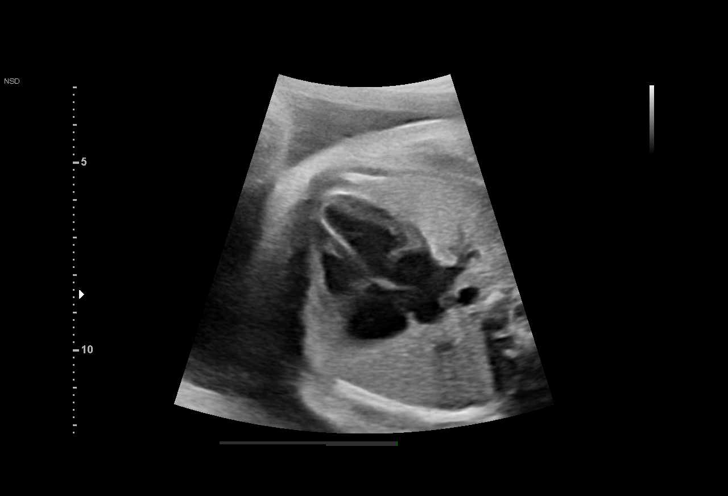
[im 27/30]
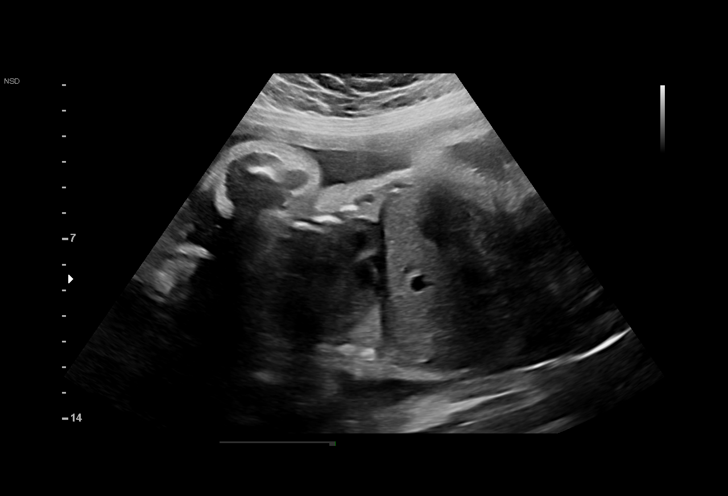
[im 30/30]
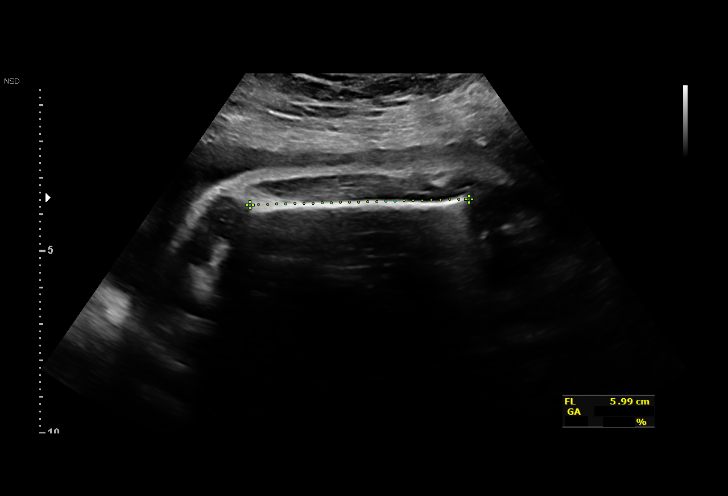

[14 of 28 positions shown; findings below may reference images not displayed]

----------------------------------------------------------------------

 ----------------------------------------------------------------------
Indications

  Bicornuate uterus, 2ND trimester (H/O
  resection of rudimentary horn)
  32 weeks gestation of pregnancy
  Rh negative state in antepartum
  Encounter for antenatal screening for
  malformations (low risk NIPS)
  Decreased fetal movements, third trimester,
  unspecified
 ----------------------------------------------------------------------
Fetal Evaluation

 Num Of Fetuses:         1
 Fetal Heart Rate(bpm):  128
 Cardiac Activity:       Observed
 Presentation:           Breech
 Placenta:               Left lateral
 P. Cord Insertion:      Previously Visualized

 Amniotic Fluid
 AFI FV:      Within normal limits

 AFI Sum(cm)     %Tile       Largest Pocket(cm)
 15.75           56

 RUQ(cm)       RLQ(cm)       LUQ(cm)        LLQ(cm)

Biometry
 BPD:      80.9  mm     G. Age:  32w 3d         56  %    CI:         75.7   %    70 - 86
                                                         FL/HC:      20.2   %    19.1 -
 HC:      294.8  mm     G. Age:  32w 4d         27  %    HC/AC:      1.03        0.96 -
 AC:      286.3  mm     G. Age:  32w 5d         68  %    FL/BPD:     73.7   %    71 - 87
 FL:       59.6  mm     G. Age:  31w 0d         15  %    FL/AC:      20.8   %    20 - 24
 HUM:      54.4  mm     G. Age:  31w 4d         43  %
 LV:        8.5  mm

 Est. FW:    5853  gm      4 lb 4 oz     44  %
OB History

 Gravidity:    2
 Ectopic:      1
Gestational Age

 LMP:           32w 0d        Date:  11/08/18                 EDD:   08/15/19
 U/S Today:     32w 1d                                        EDD:   08/14/19
 Best:          32w 0d     Det. By:  LMP  (11/08/18)          EDD:   08/15/19
Anatomy

 Cranium:               Appears normal         Aortic Arch:            Previously seen
 Cavum:                 Previously seen        Ductal Arch:            Previously seen
 Choroid Plexus:        Previously seen        Diaphragm:              Appears normal
 Cerebellum:            Previously seen        Stomach:                Appears normal, left
                                                                       sided
 Posterior Fossa:       Previously seen        Abdomen:                Appears normal
 Nuchal Fold:           Not applicable (>20    Abdominal Wall:         Previously seen
                        wks GA)
 Face:                  Orbits and profile     Cord Vessels:           Previously seen
                        previously seen
 Lips:                  Previously seen        Kidneys:                Abnormal, see
                                                                       comments
 Palate:                Not well visualized    Bladder:                Appears normal
 Thoracic:              Appears normal         Spine:                  Previously seen
 Heart:                 Appears normal         Upper Extremities:      Previously seen
                        (4CH, axis, and
                        situs)
 RVOT:                  Previously seen        Lower Extremities:      Previously seen
 LVOT:                  Previously seen

 Other:  Male gender previously seen. Heels and Nasal bone previously
         visualized.
Cervix Uterus Adnexa

 Cervix
 Not visualized (advanced GA >63wks)

 Left Ovary
 Previously seen.

 Right Ovary
 Previously seen
Impression

 Normal interval growth.
 Unilateral left renal caliectasis with mild hydroureter, there is
 normal amniotic fluid volume.
 Good fetal movement is observed.
 Ms. Zhel has known bicornuate uterus.
 Low risk NIPS
Recommendations

 Repeat growth in 3-4 weeks.

## 2020-04-21 MED ORDER — METRONIDAZOLE 500 MG PO TABS: 500.0000 mg | ORAL_TABLET | Freq: Two times a day (BID) | ORAL | 0 refills | Status: AC

## 2020-04-21 NOTE — Progress Notes (Signed)
Chart reviewed - agree with CMA/RN documentation.  ° °

## 2020-05-14 ENCOUNTER — Ambulatory Visit: Payer: BLUE CROSS/BLUE SHIELD | Admitting: Obstetrics and Gynecology
